# Patient Record
Sex: Male | Born: 1950 | Race: Black or African American | Hispanic: No | Marital: Married | Smoking: Never smoker
Health system: Southern US, Community
[De-identification: ages and names within clinical notes are randomized; demographics above are authoritative.]

## PROBLEM LIST (undated history)

## (undated) DIAGNOSIS — Z8719 Personal history of other diseases of the digestive system: Secondary | ICD-10-CM

## (undated) DIAGNOSIS — M1990 Unspecified osteoarthritis, unspecified site: Secondary | ICD-10-CM

## (undated) DIAGNOSIS — R0609 Other forms of dyspnea: Secondary | ICD-10-CM

## (undated) DIAGNOSIS — R0600 Dyspnea, unspecified: Secondary | ICD-10-CM

## (undated) DIAGNOSIS — F419 Anxiety disorder, unspecified: Secondary | ICD-10-CM

## (undated) DIAGNOSIS — Z77090 Contact with and (suspected) exposure to asbestos: Secondary | ICD-10-CM

## (undated) DIAGNOSIS — Z87442 Personal history of urinary calculi: Secondary | ICD-10-CM

## (undated) DIAGNOSIS — R319 Hematuria, unspecified: Secondary | ICD-10-CM

## (undated) DIAGNOSIS — N200 Calculus of kidney: Secondary | ICD-10-CM

## (undated) DIAGNOSIS — E119 Type 2 diabetes mellitus without complications: Secondary | ICD-10-CM

## (undated) HISTORY — DX: Anxiety disorder, unspecified: F41.9

## (undated) HISTORY — DX: Unspecified osteoarthritis, unspecified site: M19.90

## (undated) HISTORY — PX: KNEE SURGERY: SHX244

## (undated) HISTORY — PX: KNEE ARTHROSCOPY: SUR90

## (undated) HISTORY — PX: COLONOSCOPY: SHX174

## (undated) HISTORY — PX: LITHOTRIPSY: SUR834

---
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
## 2020-01-03 ENCOUNTER — Emergency Department (HOSPITAL_COMMUNITY): Payer: Medicare HMO

## 2020-01-03 ENCOUNTER — Encounter (HOSPITAL_COMMUNITY): Payer: Self-pay

## 2020-01-03 ENCOUNTER — Other Ambulatory Visit: Payer: Self-pay

## 2020-01-03 ENCOUNTER — Emergency Department (HOSPITAL_COMMUNITY)
Admission: EM | Admit: 2020-01-03 | Discharge: 2020-01-04 | Disposition: A | Discharge status: Home / Self Care | Payer: Medicare HMO | Attending: Emergency Medicine | Admitting: Emergency Medicine

## 2020-01-03 DIAGNOSIS — R319 Hematuria, unspecified: Secondary | ICD-10-CM | POA: Insufficient documentation

## 2020-01-03 DIAGNOSIS — R072 Precordial pain: Secondary | ICD-10-CM | POA: Diagnosis not present

## 2020-01-03 LAB — URINALYSIS, ROUTINE W REFLEX MICROSCOPIC

## 2020-01-03 LAB — COMPREHENSIVE METABOLIC PANEL
ALT: 24 U/L (ref 0–44)
AST: 20 U/L (ref 15–41)
Albumin: 4.1 g/dL (ref 3.5–5.0)
Alkaline Phosphatase: 56 U/L (ref 38–126)
Anion gap: 11 (ref 5–15)
BUN: 13 mg/dL (ref 8–23)
CO2: 24 mmol/L (ref 22–32)
Calcium: 9.4 mg/dL (ref 8.9–10.3)
Chloride: 104 mmol/L (ref 98–111)
Creatinine, Ser: 1.21 mg/dL (ref 0.61–1.24)
GFR calc Af Amer: >60 mL/min (ref >60)
GFR calc non Af Amer: >60 mL/min (ref >60)
Glucose, Bld: 122 mg/dL — ABNORMAL HIGH (ref 70–99)
Potassium: 3.7 mmol/L (ref 3.5–5.1)
Sodium: 139 mmol/L (ref 135–145)
Total Bilirubin: 0.9 mg/dL (ref 0.3–1.2)
Total Protein: 7.8 g/dL (ref 6.5–8.1)

## 2020-01-03 LAB — CBC WITH DIFFERENTIAL/PLATELET
Abs Immature Granulocytes: 0.02 10*3/uL (ref 0.00–0.07)
Basophils Absolute: 0 10*3/uL (ref 0.0–0.1)
Basophils Relative: 1 %
Eosinophils Absolute: 0.1 10*3/uL (ref 0.0–0.5)
Eosinophils Relative: 1 %
HCT: 46.2 % (ref 39.0–52.0)
Hemoglobin: 15.1 g/dL (ref 13.0–17.0)
Immature Granulocytes: 0 %
Lymphocytes Relative: 20 %
Lymphs Abs: 1.4 10*3/uL (ref 0.7–4.0)
MCH: 32.8 pg (ref 26.0–34.0)
MCHC: 32.7 g/dL (ref 30.0–36.0)
MCV: 100.4 fL — ABNORMAL HIGH (ref 80.0–100.0)
Monocytes Absolute: 0.5 10*3/uL (ref 0.1–1.0)
Monocytes Relative: 7 %
Neutro Abs: 5 10*3/uL (ref 1.7–7.7)
Neutrophils Relative %: 71 %
Platelets: 253 10*3/uL (ref 150–400)
RBC: 4.6 MIL/uL (ref 4.22–5.81)
RDW: 13.2 % (ref 11.5–15.5)
WBC: 7 10*3/uL (ref 4.0–10.5)
nRBC: 0 % (ref 0.0–0.2)

## 2020-01-03 LAB — URINALYSIS, MICROSCOPIC (REFLEX): RBC / HPF: >50 RBC/hpf (ref 0–5)

## 2020-01-03 LAB — TROPONIN I (HIGH SENSITIVITY)
Troponin I (High Sensitivity): 6 ng/L (ref <18)
Troponin I (High Sensitivity): 7 ng/L (ref <18)

## 2020-01-03 MED ORDER — CEPHALEXIN 500 MG PO CAPS: 500.0000 mg | ORAL_CAPSULE | Freq: Four times a day (QID) | ORAL | 0 refills | Status: DC

## 2020-01-03 NOTE — ED Notes (Signed)
Pt in waiting room, pt states that he started having some pressure in his chest, ekg done, pt to hallway 1

## 2020-01-03 NOTE — ED Provider Notes (Signed)
[facility] EMERGENCY DEPARTMENT Provider Note   CSN: [id number] Arrival date & time: 01/03/20  1219     History Chief Complaint  Patient presents with  . Hematuria    Zachary Morrison is a 69 y.o. male.  Patient with a 2 to 3-week history of intermittent gross blood in the urine.  Patient in the past has had trouble with kidney stones.  He has had no abdominal pain or flank pain or back pain with this episode.  [facility] contacted [facility] they recommend he come in in the emergency department for evaluation.  He has not been seen by them in the past.  Patient is from [place].  Needs to see a urologist up there.  Today urine was very dark in color.  But since he has been waiting he has urinated 2 or 3 times and it has been clear.  No gross hematuria.  Patient while in the waiting room somewhere around 6:30 PM developed some intermittent anterior chest pain.  Lasting only a minute or 2.  Not constant.  Patient has not had anything like that before.  Past medical history noncontributory.  Patient denies any history of cardiac disease.  His past medical history is only been for the lithotripsy 3 times.  Patient currently chest pain has resolved.        History reviewed. No pertinent past medical history.  There are no problems to display for this patient.   Past Surgical History:  Procedure Laterality Date  . KNEE SURGERY    . LITHOTRIPSY     three times       History reviewed. No pertinent family history.  Social History   Tobacco Use  . Smoking status: Never Smoker  . Smokeless tobacco: Never Used  Vaping Use  . Vaping Use: Never used  Substance Use Topics  . Alcohol use: Not Currently  . Drug use: Never    Home Medications Prior to Admission medications   Not on File    Allergies    Patient has no allergy information on record.  Review of Systems   Review of Systems  Constitutional: Negative for chills and fever.  HENT: Negative for congestion,  rhinorrhea and sore throat.   Eyes: Negative for visual disturbance.  Respiratory: Negative for cough and shortness of breath.   Cardiovascular: Positive for chest pain. Negative for leg swelling.  Gastrointestinal: Negative for abdominal pain, diarrhea, nausea and vomiting.  Genitourinary: Positive for hematuria. Negative for dysuria.  Musculoskeletal: Negative for back pain and neck pain.  Skin: Negative for rash.  Neurological: Negative for dizziness, light-headedness and headaches.  Hematological: Does not bruise/bleed easily.  Psychiatric/Behavioral: Negative for confusion.    Physical Exam Updated Vital Signs BP (!) 195/101 (BP Location: Right Arm)   Pulse 83   Temp (!) 97.5 F (36.4 C) (Oral)   Resp (!) 22   Ht 1.829 m (6')   Wt 85.7 kg   SpO2 100%   BMI 25.63 kg/m   Physical Exam Vitals and nursing note reviewed.  Constitutional:      Appearance: Normal appearance. He is well-developed.  HENT:     Head: Normocephalic and atraumatic.  Eyes:     Extraocular Movements: Extraocular movements intact.     Conjunctiva/sclera: Conjunctivae normal.     Pupils: Pupils are equal, round, and reactive to light.  Cardiovascular:     Rate and Rhythm: Normal rate and regular rhythm.     Heart sounds: No murmur heard.   Pulmonary:  Effort: Pulmonary effort is normal. No respiratory distress.     Breath sounds: Normal breath sounds.  Abdominal:     Palpations: Abdomen is soft.     Tenderness: There is no abdominal tenderness.     Comments: Patient with a infraumbilical hernia repair scar.  Has a little bit of a bulging mass to the right side of the umbilicus.  But nontender.  Musculoskeletal:        General: No swelling, tenderness or deformity. Normal range of motion.     Cervical back: Normal range of motion and neck supple.  Skin:    General: Skin is warm and dry.  Neurological:     General: No focal deficit present.     Mental Status: He is alert and oriented to  person, place, and time.     Cranial Nerves: No cranial nerve deficit.     Sensory: No sensory deficit.     ED Results / Procedures / Treatments   Labs (all labs ordered are listed, but only abnormal results are displayed) Labs Reviewed  URINALYSIS, ROUTINE W REFLEX MICROSCOPIC - Abnormal; Notable for the following components:      Result Value   Color, Urine RED (*)    APPearance TURBID (*)    Glucose, UA   (*)    Value: TEST NOT REPORTED DUE TO COLOR INTERFERENCE OF URINE PIGMENT   Hgb urine dipstick   (*)    Value: TEST NOT REPORTED DUE TO COLOR INTERFERENCE OF URINE PIGMENT   Bilirubin Urine   (*)    Value: TEST NOT REPORTED DUE TO COLOR INTERFERENCE OF URINE PIGMENT   Ketones, ur   (*)    Value: TEST NOT REPORTED DUE TO COLOR INTERFERENCE OF URINE PIGMENT   Protein, ur   (*)    Value: TEST NOT REPORTED DUE TO COLOR INTERFERENCE OF URINE PIGMENT   Nitrite   (*)    Value: TEST NOT REPORTED DUE TO COLOR INTERFERENCE OF URINE PIGMENT   Leukocytes,Ua   (*)    Value: TEST NOT REPORTED DUE TO COLOR INTERFERENCE OF URINE PIGMENT   All other components within normal limits  URINALYSIS, MICROSCOPIC (REFLEX) - Abnormal; Notable for the following components:   Bacteria, UA FEW (*)    Non Squamous Epithelial PRESENT (*)    All other components within normal limits  COMPREHENSIVE METABOLIC PANEL - Abnormal; Notable for the following components:   Glucose, Bld 122 (*)    All other components within normal limits  CBC WITH DIFFERENTIAL/PLATELET - Abnormal; Notable for the following components:   MCV 100.4 (*)    All other components within normal limits  URINE CULTURE  TROPONIN I (HIGH SENSITIVITY)  TROPONIN I (HIGH SENSITIVITY)    EKG EKG Interpretation  Date/Time:  Friday January 03 2020 18:47:49 EDT Ventricular Rate:  74 PR Interval:  180 QRS Duration: 82 QT Interval:  392 QTC Calculation: 435 R Axis:   44 Text Interpretation: Normal sinus rhythm Normal ECG no  previous) Confirmed by Fredia Sorrow [phone]) on 01/03/2020 7:19:07 PM   Radiology DG Chest 2 View  Result Date: 01/03/2020 CLINICAL DATA:  Chest pain EXAM: CHEST - 2 VIEW COMPARISON:  None. FINDINGS: Probable emphysematous disease. Streaky lower lobe opacities. Normal heart size. No pneumothorax. IMPRESSION: Probable emphysematous disease. Streaky lower lobe opacities may reflect chronic bronchitic change though no priors available for comparison. Could also consider atypical or viral pneumonia. Electronically Signed   By: Donavan Foil M.D.   On:  01/03/2020 19:54    Procedures Procedures (including critical care time)  Medications Ordered in ED Medications - No data to display  ED Course  I have reviewed the triage vital signs and the nursing notes.  Pertinent labs & imaging results that were available during my care of the patient were reviewed by me and considered in my medical decision making (see chart for details).    MDM Rules/Calculators/A&P                          Chest x-ray raises some questions for an atypical or viral pneumonia.  But patient is completely asymptomatic.  Urine does have a lot of RBCs in it.  Urine sent for culture.  Patient's kidney functions very normal.  Patient's urine is now clear.  For the chest pain troponins x2 were negative.  Never had constant chest pain.  No prior cardiac history.  Pain only lasted a few minutes at a time and just started at about 6:30 PM.  No chest pain now.  We will start patient on Keflex plug him into follow-up with urology for the hematuria.  It is painless hematuria.      Final Clinical Impression(s) / ED Diagnoses Final diagnoses:  Hematuria, unspecified type  Precordial pain    Rx / DC Orders ED Discharge Orders    None       Fredia Sorrow, MD 01/03/20 2349

## 2020-01-03 NOTE — Discharge Instructions (Addendum)
Follow-up with your primary care doctor regarding the chest pain.  The work-up for that without any acute findings.  Urine culture sent and is pending.  For the hematuria take the antibiotic Keflex as directed.  On Tuesday call [facility] here in [place] for follow-up of the hematuria.  Dr. Gloriann Loan on-call tonight for [facility].  Return for any chest pain lasting 20 minutes or longer.  Return for the development of any new or worse symptoms regarding the hematuria or develop of any flank pain back pain or abdominal pain.  Or any fevers.

## 2020-01-03 NOTE — ED Triage Notes (Signed)
Pt to er, pt states that he is here for blood in his urine, states that it has been going on for the past two weeks. States that last night it was worse, today it is better.  Pt denies pain or discharge.

## 2020-01-05 LAB — URINE CULTURE: Culture: NO GROWTH

## 2020-01-16 ENCOUNTER — Other Ambulatory Visit: Payer: Self-pay | Admitting: Family Medicine

## 2020-01-21 ENCOUNTER — Other Ambulatory Visit (HOSPITAL_COMMUNITY): Payer: Self-pay | Admitting: Family Medicine

## 2020-01-21 ENCOUNTER — Other Ambulatory Visit: Payer: Self-pay | Admitting: Family Medicine

## 2020-01-21 DIAGNOSIS — R109 Unspecified abdominal pain: Secondary | ICD-10-CM

## 2020-01-28 ENCOUNTER — Encounter: Payer: Self-pay | Admitting: Urology

## 2020-01-28 ENCOUNTER — Other Ambulatory Visit: Payer: Self-pay

## 2020-01-28 ENCOUNTER — Ambulatory Visit (INDEPENDENT_AMBULATORY_CARE_PROVIDER_SITE_OTHER): Payer: Medicare HMO | Admitting: Urology

## 2020-01-28 VITALS — BP 129/72 | HR 93

## 2020-01-28 DIAGNOSIS — R319 Hematuria, unspecified: Secondary | ICD-10-CM

## 2020-01-28 DIAGNOSIS — N200 Calculus of kidney: Secondary | ICD-10-CM | POA: Diagnosis not present

## 2020-01-28 LAB — MICROSCOPIC EXAMINATION
Bacteria, UA: NONE SEEN
Epithelial Cells (non renal): NONE SEEN /hpf (ref 0–10)

## 2020-01-28 LAB — URINALYSIS, ROUTINE W REFLEX MICROSCOPIC
Bilirubin, UA: NEGATIVE
Glucose, UA: NEGATIVE
Ketones, UA: NEGATIVE
Leukocytes,UA: NEGATIVE
Nitrite, UA: NEGATIVE
Protein,UA: NEGATIVE
Specific Gravity, UA: 1.02 (ref 1.005–1.030)
Urobilinogen, Ur: 0.2 mg/dL (ref 0.2–1.0)
pH, UA: 5 (ref 5.0–7.5)

## 2020-01-28 NOTE — Progress Notes (Signed)
H&P  Chief Complaint: Gross Hematuria  History of Present Illness: Pt is here for f/u of hematuria after hospitalization. Pt reports that he has a history of kidney stones including 3 surgeries (sounds like ureteroscopies). Pt first noticed blood in his urine approximately 2 years ago and went to the hospital when the "bleeding got heavy". During this period, he experienced hematuria more than once per month and that these episodes were accompanied with dysuria and flank pain (R). Pt reports no gross hematuria following hospitalization. Pt not currently taking aspirin or any other anticoagulents.   No past medical history on file.  Past Surgical History:  Procedure Laterality Date  . KNEE SURGERY    . LITHOTRIPSY     three times    Home Medications:  Allergies as of 01/28/2020   Not on File     Medication List       Accurate as of January 28, 2020 10:06 AM. If you have any questions, ask your nurse or doctor.        cephALEXin 500 MG capsule Commonly known as: KEFLEX Take 1 capsule (500 mg total) by mouth 4 (four) times daily.       Allergies: Not on File  No family history on file.  Social History:  reports that he has never smoked. He has never used smokeless tobacco. He reports previous alcohol use. He reports that he does not use drugs.  ROS: A complete review of systems was performed.  All systems are negative except for pertinent findings as noted.  Physical Exam:  Vital signs in last 24 hours: There were no vitals taken for this visit. Constitutional:  Alert and oriented, No acute distress Respiratory: Normal respiratory effort GI: Abdomen is soft, nontender, nondistended, no abdominal masses. Mild right sidee CVAT. Tenderness on RLQ. Neurologic: Grossly intact, no focal deficits Psychiatric: Normal mood and affect  I have reviewed prior pt notes  I have reviewed notes from referring/previous physicians--Er notes  I have reviewed urinalysis  results    I have reviewed prior urine culture Radiologic Imaging: No results found.  Impression/Assessment:  Gross hematuria, intermittent over a long period of time with history of tobacco use but also significant for urolithiasis.  Plan:  1. Pt reassured regarding his symptomatology and advised regarding hematuria protocol - CT scan and then cystoscopy.  2. Pt to have CT scan at next available appointment. Once scheduled, cancel his previously scheduled CT scan (for 02/03/2020).  3. Will F/U with OV and cysto at next available appt. following CT scan.  CC: Dr. Ledell Peoples

## 2020-01-28 NOTE — Progress Notes (Signed)
Urological Symptom Review  Patient is experiencing the following symptoms: Blood in urine   Review of Systems  Gastrointestinal (upper)  : Negative for upper GI symptoms  Gastrointestinal (lower) : Negative for lower GI symptoms  Constitutional : Negative for symptoms  Skin: Negative for skin symptoms  Eyes: Negative for eye symptoms  Ear/Nose/Throat : Negative for Ear/Nose/Throat symptoms  Hematologic/Lymphatic: Negative for Hematologic/Lymphatic symptoms  Cardiovascular : Negative for cardiovascular symptoms  Respiratory : Shortness of breath  Endocrine: Negative for endocrine symptoms  Musculoskeletal: Negative for musculoskeletal symptoms  Neurological: Negative for neurological symptoms  Psychologic: Negative for psychiatric symptoms  

## 2020-01-31 ENCOUNTER — Telehealth: Payer: Self-pay | Admitting: Urology

## 2020-01-31 NOTE — Telephone Encounter (Signed)
Left message for pt that Dr. Diona Fanti order states he does not need oral contrast.

## 2020-01-31 NOTE — Telephone Encounter (Signed)
Our order for CT said no oral contrast but PCP order says oral contrast. Patient asked if he needs to pick it up or not?

## 2020-02-03 ENCOUNTER — Ambulatory Visit (HOSPITAL_COMMUNITY): Payer: Medicare HMO

## 2020-02-03 ENCOUNTER — Encounter (HOSPITAL_COMMUNITY): Payer: Self-pay

## 2020-02-14 ENCOUNTER — Other Ambulatory Visit (HOSPITAL_COMMUNITY): Payer: Medicare HMO

## 2020-02-14 ENCOUNTER — Ambulatory Visit (HOSPITAL_COMMUNITY)
Admission: RE | Admit: 2020-02-14 | Discharge: 2020-02-14 | Disposition: A | Discharge status: Home / Self Care | Payer: Medicare HMO | Source: Ambulatory Visit | Attending: Urology | Admitting: Urology

## 2020-02-14 ENCOUNTER — Encounter (HOSPITAL_COMMUNITY): Payer: Self-pay

## 2020-02-14 DIAGNOSIS — R319 Hematuria, unspecified: Secondary | ICD-10-CM

## 2020-02-14 LAB — POCT I-STAT CREATININE: Creatinine, Ser: 0.8 mg/dL (ref 0.61–1.24)

## 2020-02-14 MED ORDER — IOHEXOL 350 MG/ML SOLN
100.0000 mL | Freq: Once | INTRAVENOUS | Status: AC | PRN
  Administered 2020-02-14: 100 mL via INTRAVENOUS

## 2020-02-25 ENCOUNTER — Other Ambulatory Visit: Payer: Self-pay

## 2020-02-25 ENCOUNTER — Ambulatory Visit (INDEPENDENT_AMBULATORY_CARE_PROVIDER_SITE_OTHER): Payer: Medicare HMO | Admitting: Urology

## 2020-02-25 ENCOUNTER — Encounter: Payer: Self-pay | Admitting: Urology

## 2020-02-25 VITALS — BP 162/70 | HR 75 | Temp 98.7°F | Ht 72.0 in | Wt 189.0 lb

## 2020-02-25 DIAGNOSIS — R319 Hematuria, unspecified: Secondary | ICD-10-CM | POA: Diagnosis not present

## 2020-02-25 DIAGNOSIS — N200 Calculus of kidney: Secondary | ICD-10-CM

## 2020-02-25 LAB — MICROSCOPIC EXAMINATION
Bacteria, UA: NONE SEEN
Epithelial Cells (non renal): NONE SEEN /hpf (ref 0–10)
Renal Epithel, UA: NONE SEEN /hpf
WBC, UA: NONE SEEN /hpf (ref 0–5)

## 2020-02-25 LAB — URINALYSIS, ROUTINE W REFLEX MICROSCOPIC
Bilirubin, UA: NEGATIVE
Glucose, UA: NEGATIVE
Ketones, UA: NEGATIVE
Leukocytes,UA: NEGATIVE
Nitrite, UA: NEGATIVE
Protein,UA: NEGATIVE
Specific Gravity, UA: 1.025 (ref 1.005–1.030)
Urobilinogen, Ur: 0.2 mg/dL (ref 0.2–1.0)
pH, UA: 5 (ref 5.0–7.5)

## 2020-02-25 MED ORDER — CIPROFLOXACIN HCL 500 MG PO TABS: 500.0000 mg | ORAL_TABLET | Freq: Once | ORAL | Status: DC

## 2020-02-25 NOTE — Addendum Note (Signed)
Addended byIris Pert on: 02/25/2020 04:28 PM   Modules accepted: Orders

## 2020-02-25 NOTE — Progress Notes (Signed)
Urological Symptom Review  Patient is experiencing the following symptoms: Frequent urination Burning/pain with urination Get up at night to urinate Stream starts and stops Blood in urine Erection problems (male only)   Review of Systems  Gastrointestinal (upper)  : Negative for upper GI symptoms  Gastrointestinal (lower) : Negative for lower GI symptoms  Constitutional : Fatigue  Skin: Negative for skin symptoms  Eyes: Negative for eye symptoms  Ear/Nose/Throat : Sinus problems  Hematologic/Lymphatic: Negative for Hematologic/Lymphatic symptoms  Cardiovascular : Negative for cardiovascular symptoms  Respiratory : Shortness of breath  Endocrine: Negative for endocrine symptoms  Musculoskeletal: Joint pain  Neurological: Negative for neurological symptoms  Psychologic: Negative for psychiatric symptoms

## 2020-02-25 NOTE — Progress Notes (Signed)
   History of Present Illness: Here for follow-up of hematuria.  9.28.2021: Pt is here for f/u of hematuria after hospitalization. Pt reports that he has a history of kidney stones including 3 surgeries (sounds like ureteroscopies). Pt first noticed blood in his urine approximately 2 years ago and went to the hospital when the "bleeding got heavy". During this period, he experienced hematuria more than once per month and that these episodes were accompanied with dysuria and flank pain (R). Pt reports no gross hematuria following hospitalization. Pt not currently taking aspirin or any other anticoagulents.  10.15.2021: Hematuria CT-- 1. The distal left ureter is patulous, with a somewhat nodular and web-like filling defect in the distal third of the ureter, difficult to accurately measure although approximately 5-6 mm in size and is located approximately 5 cm proximal to the ureterovesicular junction. This may reflect clot within the ureter although is somewhat concerning for urothelial malignancy. 2. No evidence of calculus, mass, or other filling defect. 3. 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 4. Aortic Atherosclerosis (ICD10-I70.0). 5.  Emphysema (ICD10-J43.9).  10.26.2021: Past Medical History:  Diagnosis Date  . Anxiety   . Arthritis   . Kidney stone     Past Surgical History:  Procedure Laterality Date  . KNEE SURGERY    . LITHOTRIPSY     three times    Home Medications:  Allergies as of 02/25/2020   No Known Allergies     Medication List       Accurate as of February 25, 2020 12:37 PM. If you have any questions, ask your nurse or doctor.        cephALEXin 500 MG capsule Commonly known as: KEFLEX Take 1 capsule (500 mg total) by mouth 4 (four) times daily.       Allergies: No Known Allergies  No family history on file.  Social History:  reports that he has never smoked. He has never used  smokeless tobacco. He reports previous alcohol use. He reports that he does not use drugs.  ROS: A complete review of systems was performed.  All systems are negative except for pertinent findings as noted.  Physical Exam:  Vital signs in last 24 hours: There were no vitals taken for this visit. Constitutional:  Alert and oriented, No acute distress Cardiovascular: Regular rate  Respiratory: Normal respiratory effort GI: Abdomen is soft, nontender, nondistended, no abdominal masses. No CVAT.  Genitourinary: Normal male phallus, testes are descended bilaterally and non-tender and without masses, scrotum is normal in appearance without lesions or masses, perineum is normal on inspection. Lymphatic: No lymphadenopathy Neurologic: Grossly intact, no focal deficits Psychiatric: Normal mood and affect  I have reviewed prior pt notes  I have reviewed notes from referring/previous physicians  I have reviewed urinalysis results  I have independently reviewed prior imaging--recent CT    Impression/Assessment:  Hematuria with left ureteral abnormality, in the left distal ureter.  This could be just a spurious finding or an intra ureteral abnormality  Plan:  1.  I will send urine for cytology and urine culture  2.  I skipped the cystoscopy today, as I think he will need anesthetic cystoscopy, left retrograde ureteropyelogram, possible left ureteroscopy.  I let him know that if he does have a small bladder abnormality we would resect that at the time.  We will do this as an outpatient

## 2020-02-27 LAB — CYTOLOGY, URINE

## 2020-02-28 LAB — URINE CULTURE

## 2020-03-06 ENCOUNTER — Other Ambulatory Visit: Payer: Self-pay | Admitting: Urology

## 2020-03-09 ENCOUNTER — Encounter (HOSPITAL_COMMUNITY): Payer: Self-pay | Admitting: Urology

## 2020-03-09 ENCOUNTER — Other Ambulatory Visit: Payer: Self-pay

## 2020-03-09 ENCOUNTER — Other Ambulatory Visit (HOSPITAL_COMMUNITY)
Admission: RE | Admit: 2020-03-09 | Discharge: 2020-03-09 | Disposition: A | Discharge status: Home / Self Care | Payer: Medicare HMO | Source: Ambulatory Visit | Attending: Urology | Admitting: Urology

## 2020-03-09 DIAGNOSIS — Z01812 Encounter for preprocedural laboratory examination: Secondary | ICD-10-CM | POA: Diagnosis present

## 2020-03-09 DIAGNOSIS — Z20822 Contact with and (suspected) exposure to covid-19: Secondary | ICD-10-CM | POA: Insufficient documentation

## 2020-03-09 LAB — SARS CORONAVIRUS 2 (TAT 6-24 HRS): SARS Coronavirus 2: NEGATIVE

## 2020-03-09 NOTE — Progress Notes (Addendum)
COVID Vaccine Completed: Yes Date COVID Vaccine completed: 05/31/19, 06/28/2019 COVID vaccine manufacturer:  Moderna     PCP - Dr. Ledell Peoples, [place], [place] Cardiologist -  Has not seen in over 10 years  Chest x-ray - 01/03/20 in epic EKG - 01/08/20 in epic Stress Test - greater than 2 years ECHO - greater than 2 years Cardiac Cath - greater than 2 years Pacemaker/ICD device last checked:N/A  Sleep Study - N/A CPAP -  N/A   Fasting Blood Sugar -  N/A Checks Blood Sugar __ N/A___ times a day  Blood Thinner Instructions: N/A Aspirin Instructions: N/A Last Dose: N/A  Anesthesia review: N/A  Patient denies shortness of breath, fever, cough and chest pain at PAT appointment   Patient verbalized understanding of instructions that were given to them at the PAT appointment. Patient was also instructed that they will need to review over the PAT instructions again at home before surgery.

## 2020-03-10 NOTE — Patient Instructions (Addendum)
DUE TO COVID-19 ONLY ONE VISITOR IS ALLOWED TO COME WITH YOU AND STAY IN THE WAITING ROOM ONLY DURING PRE OP AND PROCEDURE DAY OF SURGERY. THE 1 VISITOR  MAY VISIT WITH YOU AFTER SURGERY IN YOUR PRIVATE ROOM DURING VISITING HOURS ONLY!  ONCE YOUR COVID TEST IS COMPLETED,  PLEASE BEGIN THE QUARANTINE INSTRUCTIONS AS OUTLINED IN YOUR HANDOUT.                Zachary Morrison    Your procedure is scheduled on: 03/12/20   Report to [facility] Main  Entrance   Report to Short stay at 5:30 AM     Call this number if you have problems the morning of surgery [phone]    Remember: Do not eat food or drink liquids :After Midnight  . BRUSH YOUR TEETH MORNING OF SURGERY AND RINSE YOUR MOUTH OUT, NO CHEWING GUM CANDY OR MINTS.     Take these medicines the morning of surgery with A SIP OF WATER: none                                 You may not have any metal on your body including              piercings  Do not wear jewelry,  lotions, powders or deodorant             Men may shave face and neck.   Do not bring valuables to the hospital. [place].  Contacts, dentures or bridgework may not be worn into surgery.  .     Patients discharged the day of surgery will not be allowed to drive home  . IF YOU ARE HAVING SURGERY AND GOING HOME THE SAME DAY, YOU MUST HAVE AN ADULT TO DRIVE YOU HOME AND BE WITH YOU FOR 24 HOURS.   YOU MAY GO HOME BY TAXI OR UBER OR ORTHERWISE, BUT AN ADULT MUST ACCOMPANY YOU HOME AND STAY WITH YOU FOR 24 HOURS.  Name and phone number of your driver:  Special Instructions: N/A              Please read over the following fact sheets you were given: _____________________________________________________________________             [facility] - Preparing for Surgery  Before surgery, you can play an important role.   Because skin is not sterile, your skin needs to be as free of germs as possible.   You can  reduce the number of germs on your skin by washing with CHG (chlorahexidine gluconate) soap before surgery.   CHG is an antiseptic cleaner which kills germs and bonds with the skin to continue killing germs even after washing. Please DO NOT use if you have an allergy to CHG or antibacterial soaps.   If your skin becomes reddened/irritated stop using the CHG and inform your nurse when you arrive at Short Stay.  You may shave your face/neck.  Please follow these instructions carefully:  1.  Shower with CHG Soap the night before surgery and the  morning of Surgery.  2.  If you choose to wash your hair, wash your hair first as usual with your  normal  shampoo.  3.  After you shampoo, rinse your hair and body thoroughly to remove the  shampoo.  4.  Use CHG as you would any other liquid soap.  You can apply chg directly  to the skin and wash                       Gently with a scrungie or clean washcloth.  5.  Apply the CHG Soap to your body ONLY FROM THE NECK DOWN.   Do not use on face/ open                           Wound or open sores. Avoid contact with eyes, ears mouth and genitals (private parts).                       Wash face,  Genitals (private parts) with your normal soap.             6.  Wash thoroughly, paying special attention to the area where your surgery  will be performed.  7.  Thoroughly rinse your body with warm water from the neck down.  8.  DO NOT shower/wash with your normal soap after using and rinsing off  the CHG Soap.             9.  Pat yourself dry with a clean towel.            10.  Wear clean pajamas.            11.  Place clean sheets on your bed the night of your first shower and do not  sleep with pets. Day of Surgery : Do not apply any lotions/deodorants the morning of surgery.  Please wear clean clothes to the hospital/surgery center.  FAILURE TO FOLLOW THESE INSTRUCTIONS MAY RESULT IN THE CANCELLATION OF YOUR  SURGERY PATIENT SIGNATURE_________________________________  NURSE SIGNATURE__________________________________  ________________________________________________________________________

## 2020-03-11 ENCOUNTER — Inpatient Hospital Stay (HOSPITAL_COMMUNITY)
Admission: RE | Admit: 2020-03-11 | Discharge: 2020-03-11 | Disposition: A | Discharge status: Home / Self Care | Payer: Medicare HMO | Source: Ambulatory Visit

## 2020-03-11 NOTE — H&P (Signed)
H&P   History of Present Illness: 69 yo male presents for cysto, Lt RGP, possible Lt URS and stent placement. He has had evaluation for hematuria--CT revealed widened Lt ureter w/ distal filling defect. I have discussed mgmt/further eval, thus his presentation today.  Past Medical History:  Diagnosis Date  . Anxiety   . Arthritis   . Asbestos exposure   . DOE (dyspnea on exertion)    history of asbestos exposure  . Hematuria   . History of hiatal hernia   . History of kidney stones     Past Surgical History:  Procedure Laterality Date  . COLONOSCOPY    . KNEE ARTHROSCOPY Left   . LITHOTRIPSY     three times    Home Medications:  Allergies as of 03/11/2020   No Known Allergies     Medication List    Notice   Cannot display discharge medications because the patient has not yet been admitted.     Allergies: No Known Allergies  History reviewed. No pertinent family history.  Social History:  reports that he has never smoked. He has never used smokeless tobacco. He reports previous alcohol use. He reports that he does not use drugs.  ROS: A complete review of systems was performed.  All systems are negative except for pertinent findings as noted.  Physical Exam:  Vital signs in last 24 hours: Ht 6' (1.829 m)   Wt 88.5 kg   BMI 26.45 kg/m  Constitutional:  Alert and oriented, No acute distress Cardiovascular: Regular rate  Respiratory: Normal respiratory effort GI: Abdomen is soft, nontender, nondistended, no abdominal masses. No CVAT.  Genitourinary: Normal male phallus, testes are descended bilaterally and non-tender and without masses, scrotum is normal in appearance without lesions or masses, perineum is normal on inspection. Lymphatic: No lymphadenopathy Neurologic: Grossly intact, no focal deficits Psychiatric: Normal mood and affect  Laboratory Data:  No results for input(s): WBC, HGB, HCT, PLT in the last 72 hours.  No results for input(s): NA, K, CL,  GLUCOSE, BUN, CALCIUM, CREATININE in the last 72 hours.  Invalid input(s): CO3   No results found for this or any previous visit (from the past 24 hour(s)). Recent Results (from the past 240 hour(s))  SARS CORONAVIRUS 2 (TAT 6-24 HRS) Nasopharyngeal Nasopharyngeal Swab     Status: None   Collection Time: 03/09/20 11:38 AM   Specimen: Nasopharyngeal Swab  Result Value Ref Range Status   SARS Coronavirus 2 NEGATIVE NEGATIVE Final    Comment: (NOTE) SARS-CoV-2 target nucleic acids are NOT DETECTED.  The SARS-CoV-2 RNA is generally detectable in upper and lower respiratory specimens during the acute phase of infection. Negative results do not preclude SARS-CoV-2 infection, do not rule out co-infections with other pathogens, and should not be used as the sole basis for treatment or other patient management decisions. Negative results must be combined with clinical observations, patient history, and epidemiological information. The expected result is Negative.  Fact Sheet for Patients: SugarRoll.be  Fact Sheet for Healthcare Providers: https://www.woods-mathews.com/  This test is not yet approved or cleared by the [place] FDA and  has been authorized for detection and/or diagnosis of SARS-CoV-2 by FDA under an Emergency Use Authorization (EUA). This EUA will remain  in effect (meaning this test can be used) for the duration of the COVID-19 declaration under Se ction 564(b)(1) of the Act, 21 U.S.C. section 360bbb-3(b)(1), unless the authorization is terminated or revoked sooner.  Performed at [facility], [place]  [address], [place], [place] [address]     Renal Function: No results for input(s): CREATININE in the last 168 hours. CrCl cannot be calculated (Patient's most recent lab result is older than the maximum 21 days allowed.).  Radiologic Imaging: No results found.  Impression/Assessment:  Hematuria, Lt ureteral  abnormality  Plan:  Cysto, Lt RGP, possible Lt URS, stent placement

## 2020-03-12 ENCOUNTER — Ambulatory Visit (HOSPITAL_COMMUNITY): Payer: Medicare HMO

## 2020-03-12 ENCOUNTER — Ambulatory Visit (HOSPITAL_COMMUNITY): Payer: Medicare HMO | Admitting: Anesthesiology

## 2020-03-12 ENCOUNTER — Ambulatory Visit (HOSPITAL_COMMUNITY)
Admission: RE | Admit: 2020-03-12 | Discharge: 2020-03-12 | Disposition: A | Discharge status: Home / Self Care | Payer: Medicare HMO | Attending: Urology | Admitting: Urology

## 2020-03-12 ENCOUNTER — Encounter (HOSPITAL_COMMUNITY)
Admission: RE | Disposition: A | Discharge status: Home / Self Care | Payer: Self-pay | Source: Home / Self Care | Attending: Urology

## 2020-03-12 ENCOUNTER — Encounter (HOSPITAL_COMMUNITY): Payer: Self-pay | Admitting: Urology

## 2020-03-12 DIAGNOSIS — R310 Gross hematuria: Secondary | ICD-10-CM | POA: Diagnosis present

## 2020-03-12 DIAGNOSIS — N132 Hydronephrosis with renal and ureteral calculous obstruction: Secondary | ICD-10-CM | POA: Insufficient documentation

## 2020-03-12 HISTORY — DX: Other forms of dyspnea: R06.09

## 2020-03-12 HISTORY — DX: Personal history of other diseases of the digestive system: Z87.19

## 2020-03-12 HISTORY — DX: Contact with and (suspected) exposure to asbestos: Z77.090

## 2020-03-12 HISTORY — DX: Personal history of urinary calculi: Z87.442

## 2020-03-12 HISTORY — DX: Dyspnea, unspecified: R06.00

## 2020-03-12 HISTORY — PX: CYSTOSCOPY WITH RETROGRADE PYELOGRAM, URETEROSCOPY AND STENT PLACEMENT: SHX5789

## 2020-03-12 HISTORY — DX: Hematuria, unspecified: R31.9

## 2020-03-12 LAB — CBC
HCT: 44.1 % (ref 39.0–52.0)
Hemoglobin: 14.9 g/dL (ref 13.0–17.0)
MCH: 32.7 pg (ref 26.0–34.0)
MCHC: 33.8 g/dL (ref 30.0–36.0)
MCV: 96.7 fL (ref 80.0–100.0)
Platelets: 195 10*3/uL (ref 150–400)
RBC: 4.56 MIL/uL (ref 4.22–5.81)
RDW: 13.5 % (ref 11.5–15.5)
WBC: 6.3 10*3/uL (ref 4.0–10.5)
nRBC: 0 % (ref 0.0–0.2)

## 2020-03-12 SURGERY — CYSTOURETEROSCOPY, WITH RETROGRADE PYELOGRAM AND STENT INSERTION
Anesthesia: General | Site: Ureter | Laterality: Left

## 2020-03-12 MED ORDER — FENTANYL CITRATE (PF) 100 MCG/2ML IJ SOLN: 25.0000 ug | INTRAMUSCULAR | Status: DC | PRN

## 2020-03-12 MED ORDER — DEXAMETHASONE SODIUM PHOSPHATE 10 MG/ML IJ SOLN
INTRAMUSCULAR | Status: DC | PRN
  Administered 2020-03-12: 10 mg via INTRAVENOUS

## 2020-03-12 MED ORDER — ONDANSETRON HCL 4 MG/2ML IJ SOLN
INTRAMUSCULAR | Status: DC | PRN
  Administered 2020-03-12: 4 mg via INTRAVENOUS

## 2020-03-12 MED ORDER — LIDOCAINE 2% (20 MG/ML) 5 ML SYRINGE
INTRAMUSCULAR | Status: AC
  Filled 2020-03-12: qty 5

## 2020-03-12 MED ORDER — ORAL CARE MOUTH RINSE: 15.0000 mL | Freq: Once | OROMUCOSAL | Status: AC

## 2020-03-12 MED ORDER — CEFAZOLIN SODIUM-DEXTROSE 2-4 GM/100ML-% IV SOLN
2.0000 g | INTRAVENOUS | Status: AC
  Administered 2020-03-12: 2 g via INTRAVENOUS
  Filled 2020-03-12: qty 100

## 2020-03-12 MED ORDER — PROPOFOL 10 MG/ML IV BOLUS
INTRAVENOUS | Status: DC | PRN
  Administered 2020-03-12: 160 mg via INTRAVENOUS

## 2020-03-12 MED ORDER — IOHEXOL 300 MG/ML  SOLN
INTRAMUSCULAR | Status: DC | PRN
  Administered 2020-03-12: 8 mL

## 2020-03-12 MED ORDER — AMISULPRIDE (ANTIEMETIC) 5 MG/2ML IV SOLN: 10.0000 mg | Freq: Once | INTRAVENOUS | Status: DC | PRN

## 2020-03-12 MED ORDER — LACTATED RINGERS IV SOLN: INTRAVENOUS | Status: DC

## 2020-03-12 MED ORDER — FENTANYL CITRATE (PF) 100 MCG/2ML IJ SOLN
INTRAMUSCULAR | Status: DC | PRN
  Administered 2020-03-12 (×3): 25 ug via INTRAVENOUS

## 2020-03-12 MED ORDER — DEXAMETHASONE SODIUM PHOSPHATE 10 MG/ML IJ SOLN
INTRAMUSCULAR | Status: AC
  Filled 2020-03-12: qty 1

## 2020-03-12 MED ORDER — CHLORHEXIDINE GLUCONATE 0.12 % MT SOLN
15.0000 mL | Freq: Once | OROMUCOSAL | Status: AC
  Administered 2020-03-12: 15 mL via OROMUCOSAL

## 2020-03-12 MED ORDER — MEPERIDINE HCL 50 MG/ML IJ SOLN: 6.2500 mg | INTRAMUSCULAR | Status: DC | PRN

## 2020-03-12 MED ORDER — ACETAMINOPHEN 10 MG/ML IV SOLN: 1000.0000 mg | Freq: Once | INTRAVENOUS | Status: DC | PRN

## 2020-03-12 MED ORDER — ONDANSETRON HCL 4 MG/2ML IJ SOLN
INTRAMUSCULAR | Status: AC
  Filled 2020-03-12: qty 2

## 2020-03-12 MED ORDER — PROPOFOL 10 MG/ML IV BOLUS
INTRAVENOUS | Status: AC
  Filled 2020-03-12: qty 20

## 2020-03-12 MED ORDER — 0.9 % SODIUM CHLORIDE (POUR BTL) OPTIME
TOPICAL | Status: DC | PRN
  Administered 2020-03-12: 1000 mL

## 2020-03-12 MED ORDER — CEPHALEXIN 500 MG PO CAPS: 500.0000 mg | ORAL_CAPSULE | Freq: Two times a day (BID) | ORAL | 0 refills | Status: DC

## 2020-03-12 MED ORDER — OXYBUTYNIN CHLORIDE 5 MG PO TABS: 5.0000 mg | ORAL_TABLET | Freq: Three times a day (TID) | ORAL | 1 refills | Status: DC | PRN

## 2020-03-12 MED ORDER — MIDAZOLAM HCL 5 MG/5ML IJ SOLN
INTRAMUSCULAR | Status: DC | PRN
  Administered 2020-03-12: 2 mg via INTRAVENOUS

## 2020-03-12 MED ORDER — ACETAMINOPHEN 325 MG PO TABS: 325.0000 mg | ORAL_TABLET | Freq: Once | ORAL | Status: DC | PRN

## 2020-03-12 MED ORDER — FENTANYL CITRATE (PF) 100 MCG/2ML IJ SOLN
INTRAMUSCULAR | Status: AC
  Filled 2020-03-12: qty 2

## 2020-03-12 MED ORDER — LIDOCAINE 2% (20 MG/ML) 5 ML SYRINGE
INTRAMUSCULAR | Status: DC | PRN
  Administered 2020-03-12: 60 mg via INTRAVENOUS
  Administered 2020-03-12: 40 mg via INTRAVENOUS

## 2020-03-12 MED ORDER — MIDAZOLAM HCL 2 MG/2ML IJ SOLN
INTRAMUSCULAR | Status: AC
  Filled 2020-03-12: qty 2

## 2020-03-12 MED ORDER — ACETAMINOPHEN 160 MG/5ML PO SOLN: 325.0000 mg | Freq: Once | ORAL | Status: DC | PRN

## 2020-03-12 MED ORDER — SODIUM CHLORIDE 0.9 % IR SOLN
Status: DC | PRN
  Administered 2020-03-12: 4000 mL

## 2020-03-12 SURGICAL SUPPLY — 24 items
BAG URO CATCHER STRL LF (MISCELLANEOUS) ×3 IMPLANT
BASKET ZERO TIP NITINOL 2.4FR (BASKET) ×3 IMPLANT
CATH INTERMIT  6FR 70CM (CATHETERS) IMPLANT
CLOTH BEACON ORANGE TIMEOUT ST (SAFETY) ×3 IMPLANT
EXTRACTOR STONE NITINOL NGAGE (UROLOGICAL SUPPLIES) ×3 IMPLANT
GLOVE BIOGEL M 8.0 STRL (GLOVE) ×3 IMPLANT
GOWN STRL REUS W/TWL XL LVL3 (GOWN DISPOSABLE) ×3 IMPLANT
GUIDEWIRE ANG ZIPWIRE 038X150 (WIRE) IMPLANT
GUIDEWIRE STR DUAL SENSOR (WIRE) ×6 IMPLANT
IV NS 1000ML (IV SOLUTION) ×2
IV NS 1000ML BAXH (IV SOLUTION) ×1 IMPLANT
KIT TURNOVER KIT A (KITS) IMPLANT
LASER FIB FLEXIVA PULSE ID 365 (Laser) ×3 IMPLANT
MANIFOLD NEPTUNE II (INSTRUMENTS) ×3 IMPLANT
PACK CYSTO (CUSTOM PROCEDURE TRAY) ×3 IMPLANT
SHEATH URETERAL 12FRX28CM (UROLOGICAL SUPPLIES) ×3 IMPLANT
SHEATH URETERAL 12FRX35CM (MISCELLANEOUS) IMPLANT
SHEATH URETERAL 12FRX55CM (UROLOGICAL SUPPLIES) IMPLANT
STENT URET 6FRX26 CONTOUR (STENTS) ×3 IMPLANT
TRACTIP FLEXIVA PULS ID 200XHI (Laser) IMPLANT
TRACTIP FLEXIVA PULSE ID 200 (Laser)
TUBING CONNECTING 10 (TUBING) ×2 IMPLANT
TUBING CONNECTING 10' (TUBING) ×1
TUBING UROLOGY SET (TUBING) ×3 IMPLANT

## 2020-03-12 NOTE — Interval H&P Note (Signed)
History and Physical Interval Note:  03/12/2020 7:26 AM  Zachary Morrison  has presented today for surgery, with the diagnosis of LEFT URETERAL LESION.  The various methods of treatment have been discussed with the patient and family. After consideration of risks, benefits and other options for treatment, the patient has consented to  Procedure(s) with comments: CYSTOSCOPY WITH RETROGRADE PYELOGRAM, URETEROSCOPY AND STENT PLACEMENT (Left) - 61 MINS as a surgical intervention.  The patient's history has been reviewed, patient examined, no change in status, stable for surgery.  I have reviewed the patient's chart and labs.  Questions were answered to the patient's satisfaction.     Lillette Boxer Burdette Gergely

## 2020-03-12 NOTE — Anesthesia Procedure Notes (Signed)
Procedure Name: LMA Insertion Date/Time: 03/12/2020 7:38 AM Performed by: Sharlette Dense, CRNA Patient Re-evaluated:Patient Re-evaluated prior to induction Oxygen Delivery Method: Circle system utilized Preoxygenation: Pre-oxygenation with 100% oxygen Induction Type: IV induction LMA: LMA inserted LMA Size: 5.0 Number of attempts: 1 Placement Confirmation: positive ETCO2 and breath sounds checked- equal and bilateral Tube secured with: Tape Dental Injury: Teeth and Oropharynx as per pre-operative assessment

## 2020-03-12 NOTE — Transfer of Care (Signed)
Immediate Anesthesia Transfer of Care Note  Patient: Chrystopher Follansbee  Procedure(s) Performed: CYSTOSCOPY WITH RETROGRADE PYELOGRAM, URETEROSCOPY, STONE EXTRACTION  AND DOUBLE J STENT PLACEMENT (Left Ureter)  Patient Location: PACU  Anesthesia Type:General  Level of Consciousness: drowsy  Airway & Oxygen Therapy: Patient Spontanous Breathing and Patient connected to face mask oxygen  Post-op Assessment: Report given to RN and Post -op Vital signs reviewed and stable  Post vital signs: Reviewed and stable  Last Vitals:  Vitals Value Taken Time  BP 145/74 03/12/20 0841  Temp    Pulse 48 03/12/20 0842  Resp 16 03/12/20 0842  SpO2 97 % 03/12/20 0842  Vitals shown include unvalidated device data.  Last Pain:  Vitals:   03/12/20 0626  TempSrc:   PainSc: 0-No pain      Patients Stated Pain Goal: 3 (07/01/47 9692)  Complications: No complications documented.

## 2020-03-12 NOTE — Op Note (Signed)
Preoperative diagnosis: Left ureteral abnormality with hydronephrosis  Postoperative diagnosis: Left distal ureteral calculus  Principal procedure: Cystoscopy, left retrograde ureteropyelogram, fluoroscopic interpretation, left ureteroscopy, extraction of left ureteral stone, placement of 6 French by 26 cm contour double-J stent without tether  Surgeon: Axcel Horsch  Anesthesia: General with LMA  Complications: None  Specimen: Stone  Estimated blood loss: Less than 10 mL  Indications: 69 year old male with recurrent gross hematuria.  He has had evaluation in the office, including CT and cystoscopy.  CT revealed left distal ureteral filling defect, noncalcified.  He presents at this time for cystoscopy, retrograde, ureteroscopic investigation of the left ureter, possible biopsy, double-J stent placement.  I discussed the procedure with the patient including risks and complications which include bleeding, infection, need for stent, anesthetic complication, ureteral injury among others.  He understands and desires to proceed.  Findings: He had trilobar hypertrophy with moderate sized median lobe.  Moderate trabeculations throughout the bladder with normal urothelium otherwise.  Ureteral orifices somewhat difficult to identify but were normal bilaterally.  Retrograde study of the left ureter revealed J hooking of the distal ureter with filling defect consistent with probable stone.  There was no evidence of proximal dilatation and pyelocalyceal system was normal without filling defect.  Stone was tan/brown, rounded, indicative of probable uric acid stone as it was not readily seen on CT scan.  Description of procedure: The patient was properly identified and marked in the holding area.  He received preoperative IV antibiotics and was taken to the operating room where general anesthetic was administered with the LMA.  Is placed in the dorsolithotomy position.  Genitalia and perineum were prepped and  draped, proper timeout performed.  21 French panendoscope advanced into the bladder with the above-mentioned findings noted.  Retrograde study was performed of the left ureter with the assistance of a guidewire to place the open-ended catheter as there was significant J hooking.  The above-mentioned filling defect was seen.  I then advanced a guidewire with the assistance of the open-ended catheter into the upper pole calyceal system using fluoroscopic guidance.  Cystoscope and open-ended catheter were removed, I dilated the ureteral orifice with the first the inner core and then the entire 12/14 ureteral access catheter.  This was then removed over top of the safety guidewire.  The dual-lumen semirigid short ureteroscope was advanced into the left ureter.  It was somewhat difficult to navigate the orifice but I eventually got into the distal ureter where the stone was seen.  I could not grasped and extracted into the bladder with just the engage basket.  I then used a nitinol basket.  This could be more easily grasped but it was difficult to navigate through the ureterovesical junction.  It was my thought at this point that the stone was still in as the stone became dislodged.  I then got the 27 m laser fiber, and replaced the scope in the distal ureter.  However, it appeared that the stone had been brought into the bladder and the using laser was unnecessary.  The stone was visualized in the bladder, and I removed it with the cystoscope.  At this point, having navigated the entire ureter with the ureteroscope and no further abnormality seen, I placed a double-J stent using cystoscopic guidance.  26 cm x 6 French contour stent with tether removed was placed.  Adequate proximal distal curls were seen with fluoroscopy and cystoscopy, respectively.  The bladder was drained, the scope removed and the procedure terminated.  The patient was awakened and taken to the PACU in stable condition having tolerated procedure  well.

## 2020-03-12 NOTE — Anesthesia Postprocedure Evaluation (Signed)
Anesthesia Post Note  Patient: Zachary Morrison  Procedure(s) Performed: CYSTOSCOPY WITH RETROGRADE PYELOGRAM, URETEROSCOPY, STONE EXTRACTION  AND DOUBLE J STENT PLACEMENT (Left Ureter)     Patient location during evaluation: PACU Anesthesia Type: General Level of consciousness: awake and alert Pain management: pain level controlled Vital Signs Assessment: post-procedure vital signs reviewed and stable Respiratory status: spontaneous breathing, nonlabored ventilation, respiratory function stable and patient connected to nasal cannula oxygen Cardiovascular status: blood pressure returned to baseline and stable Postop Assessment: no apparent nausea or vomiting Anesthetic complications: no   No complications documented.  Last Vitals:  Vitals:   03/12/20 0904 03/12/20 0926  BP: (!) 142/77 (!) 146/77  Pulse: 93 93  Resp:  12  Temp: 36.4 C (!) 36.4 C  SpO2: 95% 95%    Last Pain:  Vitals:   03/12/20 0926  TempSrc:   PainSc: 0-No pain                 Effie Berkshire

## 2020-03-12 NOTE — Discharge Instructions (Signed)

## 2020-03-12 NOTE — Anesthesia Preprocedure Evaluation (Signed)
Anesthesia Evaluation  Patient identified by MRN, date of birth, ID band Patient awake    Reviewed: Allergy & Precautions, NPO status , Patient's Chart, lab work & pertinent test results  Airway Mallampati: I  TM Distance: >3 FB Neck ROM: Full    Dental  (+) Edentulous Upper, Edentulous Lower   Pulmonary    breath sounds clear to auscultation       Cardiovascular negative cardio ROS   Rhythm:Regular Rate:Normal     Neuro/Psych Anxiety negative neurological ROS     GI/Hepatic Neg liver ROS, hiatal hernia,   Endo/Other  negative endocrine ROS  Renal/GU negative Renal ROS     Musculoskeletal  (+) Arthritis ,   Abdominal Normal abdominal exam  (+)   Peds  Hematology negative hematology ROS (+)   Anesthesia Other Findings   Reproductive/Obstetrics                            Anesthesia Physical Anesthesia Plan  ASA: II  Anesthesia Plan: General   Post-op Pain Management:    Induction: Intravenous  PONV Risk Score and Plan: 3 and Ondansetron, Dexamethasone and Midazolam  Airway Management Planned: LMA  Additional Equipment: None  Intra-op Plan:   Post-operative Plan: Extubation in OR  Informed Consent: I have reviewed the patients History and Physical, chart, labs and discussed the procedure including the risks, benefits and alternatives for the proposed anesthesia with the patient or authorized representative who has indicated his/her understanding and acceptance.       Plan Discussed with: CRNA  Anesthesia Plan Comments:        Anesthesia Quick Evaluation

## 2020-03-13 ENCOUNTER — Encounter (HOSPITAL_COMMUNITY): Payer: Self-pay | Admitting: Urology

## 2020-03-20 ENCOUNTER — Other Ambulatory Visit: Payer: Self-pay

## 2020-03-20 ENCOUNTER — Ambulatory Visit (INDEPENDENT_AMBULATORY_CARE_PROVIDER_SITE_OTHER): Payer: Medicare HMO

## 2020-03-20 DIAGNOSIS — N200 Calculus of kidney: Secondary | ICD-10-CM | POA: Diagnosis not present

## 2020-03-20 DIAGNOSIS — R319 Hematuria, unspecified: Secondary | ICD-10-CM

## 2020-03-20 LAB — URINALYSIS, ROUTINE W REFLEX MICROSCOPIC
Bilirubin, UA: NEGATIVE
Glucose, UA: NEGATIVE
Nitrite, UA: NEGATIVE
Specific Gravity, UA: 1.025 (ref 1.005–1.030)
Urobilinogen, Ur: 1 mg/dL (ref 0.2–1.0)
pH, UA: 5.5 (ref 5.0–7.5)

## 2020-03-20 LAB — MICROSCOPIC EXAMINATION
Bacteria, UA: NONE SEEN
Epithelial Cells (non renal): NONE SEEN /hpf (ref 0–10)
RBC, Urine: >30 /hpf — AB (ref 0–2)
Renal Epithel, UA: NONE SEEN /hpf

## 2020-03-20 LAB — BLADDER SCAN AMB NON-IMAGING: Scan Result: 50

## 2020-03-20 NOTE — Progress Notes (Signed)
Bladder Scan Patient can void: 50 ml Performed By: Estill Bamberg RN   Patient had ureteral stent placed on 03/12/2020 by Dr. Diona Fanti. Pt reports he has had persistent bleeding and yesterday had difficulty with voiding and then " filled the toilet with blood and blood clots" c/o generalized lower back pain with urination.  Spoke with Dr Jeffie Pollock- PVR obtained to make sure pt does not have retention from clots. Urine sample also obtained a urine for culture.   Pt given advice to use heating pad for his back ache and increase fluid intake. Pt voiced understanding.

## 2020-03-20 NOTE — Addendum Note (Signed)
Addended by: Valentina Lucks on: 03/20/2020 04:19 PM   Modules accepted: Orders

## 2020-03-23 ENCOUNTER — Encounter (INDEPENDENT_AMBULATORY_CARE_PROVIDER_SITE_OTHER): Payer: Self-pay | Admitting: Gastroenterology

## 2020-03-23 LAB — URINE CULTURE: Organism ID, Bacteria: NO GROWTH

## 2020-03-31 ENCOUNTER — Telehealth: Payer: Self-pay

## 2020-03-31 NOTE — Telephone Encounter (Signed)
Called pt left message to call back

## 2020-03-31 NOTE — Telephone Encounter (Signed)
-----   Message from Franchot Gallo, MD sent at 03/31/2020  9:38 AM EST ----- Pt has stent--likley the cause of his bleeding. Call to check on him/reassure ----- Message ----- From: Valentina Lucks, LPN Sent: [id number]   1:30 PM EST To: Franchot Gallo, MD  This pt came in Friday saying he was bleeding and back hurting. Dr. Jeffie Pollock did a PVR and Ua and this is result. I notified pt of result and he said he is still bleeding and hurting across his back esp when he urinates. Pls advise.

## 2020-04-06 NOTE — Telephone Encounter (Signed)
Pt scheduled on 12/7 to see dr.

## 2020-04-07 ENCOUNTER — Ambulatory Visit (INDEPENDENT_AMBULATORY_CARE_PROVIDER_SITE_OTHER): Payer: Medicare HMO | Admitting: Urology

## 2020-04-07 ENCOUNTER — Other Ambulatory Visit: Payer: Self-pay

## 2020-04-07 VITALS — BP 153/75 | HR 96 | Temp 99.0°F | Ht 72.0 in | Wt 221.6 lb

## 2020-04-07 DIAGNOSIS — N200 Calculus of kidney: Secondary | ICD-10-CM

## 2020-04-07 DIAGNOSIS — N390 Urinary tract infection, site not specified: Secondary | ICD-10-CM | POA: Diagnosis not present

## 2020-04-07 LAB — URINALYSIS, ROUTINE W REFLEX MICROSCOPIC
Bilirubin, UA: NEGATIVE
Glucose, UA: NEGATIVE
Ketones, UA: NEGATIVE
Nitrite, UA: NEGATIVE
Specific Gravity, UA: 1.025 (ref 1.005–1.030)
Urobilinogen, Ur: 0.2 mg/dL (ref 0.2–1.0)
pH, UA: 5 (ref 5.0–7.5)

## 2020-04-07 LAB — MICROSCOPIC EXAMINATION
Bacteria, UA: NONE SEEN
Epithelial Cells (non renal): NONE SEEN /hpf (ref 0–10)
RBC, Urine: >30 /hpf — AB (ref 0–2)
Renal Epithel, UA: NONE SEEN /hpf

## 2020-04-07 MED ORDER — CIPROFLOXACIN HCL 500 MG PO TABS
500.0000 mg | ORAL_TABLET | Freq: Once | ORAL | Status: AC
  Administered 2020-04-07: 500 mg via ORAL

## 2020-04-07 NOTE — Progress Notes (Signed)
Urological Symptom Review  Patient is experiencing the following symptoms: Burning/pain with urination Get up at night to urinate Leakage of urine Stream starts and stops Blood in urine Injury to kidneys/bladder Erection problems (male only)  Kidney stones   Review of Systems  Gastrointestinal (upper)  : Negative for upper GI symptoms  Gastrointestinal (lower) : Negative for lower GI symptoms  Constitutional : Night Sweats Fatigue  Skin: Negative for skin symptoms  Eyes: Negative for eye symptoms  Ear/Nose/Throat : Sinus problems  Hematologic/Lymphatic: Negative for Hematologic/Lymphatic symptoms  Cardiovascular : Negative for cardiovascular symptoms  Respiratory : Shortness of breath  Endocrine: Negative for endocrine symptoms  Musculoskeletal: Back pain Joint pain  Neurological: Negative for neurological symptoms  Psychologic: Negative for psychiatric symptoms

## 2020-04-07 NOTE — Progress Notes (Signed)
H&P  Chief Complaint: Gross Hematuria  History of Present Illness:   12.7.2021: Follow-up for recent left ureteroscopy and stent placement.  He reports significant pain in his back with urination and persistent gross hematuria. He reports that his hematuria will become worse following vigorous activity but has mostly resolved during the past week.  (below copied from [place] records):  9.28.2021: Pt is here for f/u of hematuria after hospitalization. Pt reports that he has a history of kidney stones including 3 surgeries (sounds like ureteroscopies). Pt first noticed blood in his urine approximately 2 years ago and went to the hospital when the "bleeding got heavy". During this period, he experienced hematuria more than once per month and that these episodes were accompanied with dysuria and flank pain (R). Pt reports no gross hematuria following hospitalization. Pt not currently taking aspirin or any other anticoagulents.  Past Medical History:  Diagnosis Date  . Anxiety   . Arthritis   . Asbestos exposure   . DOE (dyspnea on exertion)    history of asbestos exposure  . Hematuria   . History of hiatal hernia   . History of kidney stones     Past Surgical History:  Procedure Laterality Date  . COLONOSCOPY    . CYSTOSCOPY WITH RETROGRADE PYELOGRAM, URETEROSCOPY AND STENT PLACEMENT Left 03/12/2020   Procedure: CYSTOSCOPY WITH RETROGRADE PYELOGRAM, URETEROSCOPY, STONE EXTRACTION  AND DOUBLE J STENT PLACEMENT;  Surgeon: Zachary Gallo, MD;  Location: [facility];  Service: Urology;  Laterality: Left;  45 MINS  . KNEE ARTHROSCOPY Left   . LITHOTRIPSY     three times    Home Medications:  Allergies as of 04/07/2020   No Known Allergies     Medication List       Accurate as of April 07, 2020 12:07 PM. If you have any questions, ask your nurse or doctor.        acetaminophen 500 MG tablet Commonly known as: TYLENOL Take 500 mg by mouth every 6 (six) hours as needed for moderate  pain or headache.   B-12 PO Take 1 capsule by mouth daily.   Breo Ellipta 100-25 MCG/INH Aepb Generic drug: fluticasone furoate-vilanterol Inhale 1 puff into the lungs daily.   cephALEXin 500 MG capsule Commonly known as: Keflex Take 1 capsule (500 mg total) by mouth 2 (two) times daily.   LUBRICATING EYE DROPS OP Place 1 drop into both eyes daily as needed (dry eyes).   OVER THE COUNTER MEDICATION Apply 1 application topically daily as needed (stiffness). Triderma otc cream   oxybutynin 5 MG tablet Commonly known as: DITROPAN Take 1 tablet (5 mg total) by mouth every 8 (eight) hours as needed for up to 15 doses for bladder spasms.       Allergies: No Known Allergies  No family history on file.  Social History:  reports that he has never smoked. He has never used smokeless tobacco. He reports previous alcohol use. He reports that he does not use drugs.  ROS: A complete review of systems was performed.  All systems are negative except for pertinent findings as noted.  Physical Exam:  Vital signs in last 24 hours: There were no vitals taken for this visit. Constitutional:  Alert and oriented, No acute distress Cardiovascular: Regular rate  Respiratory: Normal respiratory effort Neurologic: Grossly intact, no focal deficits Psychiatric: Normal mood and affect  I have reviewed prior pt notes  I have reviewed notes from referring/previous physicians  I have reviewed urinalysis results  I have independently reviewed prior imaging  Cystoscopy Procedure Note:  Indication: Stent extraction  After informed consent and discussion of the procedure and its risks, Zachary Morrison was positioned and prepped in the standard fashion.  Cystoscopy was performed with a flexible cystoscope.   Findings: Urethra: Normal Prostate: Elongated prostatic urethra secondary to BPH Bladder neck: Normal Ureteral orifices: Difficult to see because of some hematuria, but normally  positioned Bladder: Normal urothelium The left ureteral stent was grasped and extracted without difficulty  The patient tolerated the procedure well.      Impression/Assessment:  Left ureteral stone, status post ureteroscopic extraction, stent placement with stent extraction today  Plan:  1. Pt cystoscopic stent removal covered by cipro today.  2. F/U in 3 weeks for OV, ultrasound of his kidney, and symptom recheck.  CC: Dr. Ledell Peoples

## 2020-05-15 ENCOUNTER — Ambulatory Visit (HOSPITAL_COMMUNITY)
Admission: RE | Admit: 2020-05-15 | Discharge: 2020-05-15 | Disposition: A | Discharge status: Home / Self Care | Payer: Medicare HMO | Source: Ambulatory Visit | Attending: Urology | Admitting: Urology

## 2020-05-15 ENCOUNTER — Other Ambulatory Visit: Payer: Self-pay

## 2020-05-15 DIAGNOSIS — N200 Calculus of kidney: Secondary | ICD-10-CM | POA: Diagnosis present

## 2020-05-18 ENCOUNTER — Telehealth: Payer: Self-pay

## 2020-05-18 NOTE — Telephone Encounter (Signed)
-----   Message from Franchot Gallo, MD sent at 05/18/2020 12:53 PM EST ----- Notify pt--good news U/S showed nml kidneys--no blockage following stone Rx ----- Message ----- From: Dorisann Frames, RN Sent: 05/15/2020   4:32 PM EST To: Franchot Gallo, MD  Please review

## 2020-05-18 NOTE — Telephone Encounter (Signed)
Patient notified of results- pt rescheduled appt due to weather to 06/09/2020

## 2020-05-19 ENCOUNTER — Ambulatory Visit: Payer: Medicare HMO | Admitting: Urology

## 2020-06-09 ENCOUNTER — Encounter: Payer: Self-pay | Admitting: Urology

## 2020-06-09 ENCOUNTER — Other Ambulatory Visit: Payer: Self-pay

## 2020-06-09 ENCOUNTER — Ambulatory Visit (INDEPENDENT_AMBULATORY_CARE_PROVIDER_SITE_OTHER): Payer: Medicare HMO | Admitting: Urology

## 2020-06-09 VITALS — BP 150/75 | HR 99 | Temp 98.1°F | Ht 72.0 in | Wt 186.0 lb

## 2020-06-09 DIAGNOSIS — N5201 Erectile dysfunction due to arterial insufficiency: Secondary | ICD-10-CM | POA: Diagnosis not present

## 2020-06-09 DIAGNOSIS — R1011 Right upper quadrant pain: Secondary | ICD-10-CM | POA: Diagnosis not present

## 2020-06-09 DIAGNOSIS — N200 Calculus of kidney: Secondary | ICD-10-CM | POA: Diagnosis not present

## 2020-06-09 LAB — URINALYSIS, ROUTINE W REFLEX MICROSCOPIC
Bilirubin, UA: NEGATIVE
Glucose, UA: NEGATIVE
Ketones, UA: NEGATIVE
Leukocytes,UA: NEGATIVE
Nitrite, UA: NEGATIVE
Protein,UA: NEGATIVE
Specific Gravity, UA: 1.02 (ref 1.005–1.030)
Urobilinogen, Ur: 0.2 mg/dL (ref 0.2–1.0)
pH, UA: 5 (ref 5.0–7.5)

## 2020-06-09 LAB — MICROSCOPIC EXAMINATION
Renal Epithel, UA: NONE SEEN /hpf
WBC, UA: NONE SEEN /hpf (ref 0–5)

## 2020-06-09 MED ORDER — SILDENAFIL CITRATE 100 MG PO TABS: 100.0000 mg | ORAL_TABLET | Freq: Every day | ORAL | 99 refills | Status: DC | PRN

## 2020-06-09 NOTE — Progress Notes (Signed)
Urological Symptom Review  Patient is experiencing the following symptoms: Frequent urination Hard to postpone urination Get up at night to urinate Erection problems (male only)   Review of Systems  Gastrointestinal (upper)  : None Gastrointestinal (lower) : Negative for lower GI symptoms  Constitutional : Negative for symptoms  Skin: Negative for skin symptoms  Eyes: Negative for eye symptoms  Ear/Nose/Throat : Sinus problems  Hematologic/Lymphatic: Negative for Hematologic/Lymphatic symptoms  Cardiovascular : Negative for cardiovascular symptoms  Respiratory : Shortness of breath  Endocrine: Negative for endocrine symptoms  Musculoskeletal: Negative for musculoskeletal symptoms  Neurological: Negative for neurological symptoms  Psychologic: Negative for psychiatric symptoms

## 2020-06-09 NOTE — Progress Notes (Signed)
H&P  Chief Complaint: Hx kidney stones  History of Present Illness:   9.28.2021: Pt is here for f/u of hematuriaafterhospitalization. Pt reports that he has a history of kidney stones including 3 surgeries(sounds like ureteroscopies). Pt first noticed blood in his urine approximately 2 years ago and went to the hospital when the "bleeding got heavy". During this period, he experienced hematuria more than once per month and that these episodes were accompanied with dysuria and flank pain (R). Pt reports no gross hematuria following hospitalization. Pt not currently taking aspirin or any other anticoagulents.  12.7.2021: Follow-up for recent left ureteroscopy and stent placement.  He reports significant pain in his back with urination and persistent gross hematuria. He reports that his hematuria will become worse following vigorous activity but has mostly resolved during the past week.  [name] is here today for f/u of his recent ureteroscopy. He reports persistent, but intermittent pain in his RLQ/right flank (ongoing for last 3-4 years). The pain will present in 5 minute episodes and cannot be reproduced w/ palpation.   He denies any recent gross hematuria but notes a small amount of blood in his urine following stent removal.  IPSS Questionnaire (AUA-7): Over the past month.   1)  How often have you had a sensation of not emptying your bladder completely after you finish urinating?  3 - About half the time  2)  How often have you had to urinate again less than two hours after you finished urinating? 1 - Less than 1 time in 5  3)  How often have you found you stopped and started again several times when you urinated?  2 - Less than half the time  4) How difficult have you found it to postpone urination?  5 - Almost always  5) How often have you had a weak urinary stream?  3 - About half the time  6) How often have you had to push or strain to begin urination?  0 - Not at all  7) How  many times did you most typically get up to urinate from the time you went to bed until the time you got up in the morning?  2 - 2 times  Total score:  0-7 mildly symptomatic   8-19 moderately symptomatic   20-35 severely symptomatic  IPSS: 16 QoL Score: 4    Past Medical History:  Diagnosis Date  . Anxiety   . Arthritis   . Asbestos exposure   . DOE (dyspnea on exertion)    history of asbestos exposure  . Hematuria   . History of hiatal hernia   . History of kidney stones     Past Surgical History:  Procedure Laterality Date  . COLONOSCOPY    . CYSTOSCOPY WITH RETROGRADE PYELOGRAM, URETEROSCOPY AND STENT PLACEMENT Left 03/12/2020   Procedure: CYSTOSCOPY WITH RETROGRADE PYELOGRAM, URETEROSCOPY, STONE EXTRACTION  AND DOUBLE J STENT PLACEMENT;  Surgeon: Franchot Gallo, MD;  Location: [facility];  Service: Urology;  Laterality: Left;  45 MINS  . KNEE ARTHROSCOPY Left   . LITHOTRIPSY     three times    Home Medications:  Allergies as of 06/09/2020   No Known Allergies     Medication List       Accurate as of June 09, 2020  1:39 PM. If you have any questions, ask your nurse or doctor.        acetaminophen 500 MG tablet Commonly known as: TYLENOL Take 500 mg by mouth every 6 (  six) hours as needed for moderate pain or headache.   B-12 PO Take 1 capsule by mouth daily.   Breo Ellipta 100-25 MCG/INH Aepb Generic drug: fluticasone furoate-vilanterol Inhale 1 puff into the lungs daily.   cephALEXin 500 MG capsule Commonly known as: Keflex Take 1 capsule (500 mg total) by mouth 2 (two) times daily.   LUBRICATING EYE DROPS OP Place 1 drop into both eyes daily as needed (dry eyes).   OVER THE COUNTER MEDICATION Apply 1 application topically daily as needed (stiffness). Triderma otc cream   oxybutynin 5 MG tablet Commonly known as: DITROPAN Take 1 tablet (5 mg total) by mouth every 8 (eight) hours as needed for up to 15 doses for bladder spasms.        Allergies: No Known Allergies  No family history on file.  Social History:  reports that he has never smoked. He has never used smokeless tobacco. He reports previous alcohol use. He reports that he does not use drugs.  ROS: A complete review of systems was performed.  All systems are negative except for pertinent findings as noted.  Physical Exam:  Vital signs in last 24 hours: There were no vitals taken for this visit. Constitutional:  Alert and oriented, No acute distress Cardiovascular: Regular rate  Neurologic: Grossly intact, no focal deficits Psychiatric: Normal mood and affect  I have reviewed prior pt notes  I have reviewed notes from referring/previous physicians  I have reviewed urinalysis results  I have independently reviewed prior imaging--U/S and CT images reviewed     Impression/Assessment:  Rt sided abdominal pain--non urologic source  Urolithiasis--resolved  ED--pt desires new Rx  Plan:  1. Pt reassured about CT scan and non-urologic source for his abdominal pain.  2. Pt provided w/ order for 24 hour urine collection - results will be sent in mail.  3. F/U w/ Dr. Teryl Lucy regarding abdominal pain.  4. F/U PRN  5. Sildenafil sent in  CC: Dr. Ledell Peoples

## 2020-06-18 ENCOUNTER — Encounter (INDEPENDENT_AMBULATORY_CARE_PROVIDER_SITE_OTHER): Payer: Self-pay | Admitting: Gastroenterology

## 2020-06-18 ENCOUNTER — Ambulatory Visit (INDEPENDENT_AMBULATORY_CARE_PROVIDER_SITE_OTHER): Payer: Medicare HMO | Admitting: Gastroenterology

## 2020-06-18 ENCOUNTER — Telehealth (INDEPENDENT_AMBULATORY_CARE_PROVIDER_SITE_OTHER): Payer: Self-pay

## 2020-06-18 ENCOUNTER — Encounter (INDEPENDENT_AMBULATORY_CARE_PROVIDER_SITE_OTHER): Payer: Self-pay

## 2020-06-18 ENCOUNTER — Other Ambulatory Visit: Payer: Self-pay

## 2020-06-18 DIAGNOSIS — R1031 Right lower quadrant pain: Secondary | ICD-10-CM | POA: Diagnosis not present

## 2020-06-18 DIAGNOSIS — R109 Unspecified abdominal pain: Secondary | ICD-10-CM | POA: Insufficient documentation

## 2020-06-18 DIAGNOSIS — K625 Hemorrhage of anus and rectum: Secondary | ICD-10-CM

## 2020-06-18 DIAGNOSIS — Z1211 Encounter for screening for malignant neoplasm of colon: Secondary | ICD-10-CM

## 2020-06-18 MED ORDER — DICYCLOMINE HCL 10 MG PO CAPS: 10.0000 mg | ORAL_CAPSULE | Freq: Two times a day (BID) | ORAL | 2 refills | Status: DC | PRN

## 2020-06-18 MED ORDER — PEG 3350-KCL-NA BICARB-NACL 420 G PO SOLR: 4000.0000 mL | ORAL | 0 refills | Status: DC

## 2020-06-18 NOTE — H&P (View-Only) (Signed)
Maylon Peppers, M.D. Gastroenterology & Hepatology [facility] [address] [place], [place] [address] Primary Care Physician: Andres Shad, MD [address] [place] [address]  Referring MD: PCP  Chief Complaint: Abdominal pain  History of Present Illness: Zachary Morrison is a 70 y.o. male with past medical history of anxiety, arthritis, who presents for evaluation of abdominal pain.  Patient reports that since 2018 he has presented intermittent episodes of R flank and RUQ pain described as a dsharp pain with bsome episodes of burning sensation occasionally. There is no particular time of the day when the pain happens or any specific type of food that triggers it, food intake does not trigger it. Patient has presented some episodes of pain that wake him up during the night but this is very occasional. Has very occasional episodes of nausea w/o vomiting. Sometimes he feels bloating in the right side of his abdomen and has seen his abdomen is slightly different than the right side compared to the left side.  He denies having shingles or rash in his abdomen in the past.  The patient reports that yesterday he saw some fresh blood in his stool, but this has not happened in many years prior.  The patient denies having any fever, chills, melena, hematemesis, abdominal distention, diarrhea, jaundice, pruritus or weight loss.  CT abdomen and pelvis with IV contrast 02/14/2020 - IMPRESSION: 1. The distal left ureter is patulous, with a somewhat nodular and web-like filling defect in the distal third of the ureter, difficult to accurately measure although approximately 5-6 mm in size and is located approximately 5 cm proximal to the ureterovesicular junction. This may reflect clot within the ureter although issomewhat concerning for urothelial malignancy. 2. No evidence of calculus, mass, or other filling defect.  The patient follows  with urology for symptomatic urolithiasis.  Last EGD: never Last Colonoscopy: 10 years ago, normal per the patient  FHx: neg for any gastrointestinal/liver disease, sister had cancer of unknown origin Social: quit smoking 1999, quit drinking alcohol in 2010 but used to drink very seldom, smoked marihuana many years ago Surgical: hernia repair in abdomen  Past Medical History: Past Medical History:  Diagnosis Date  . Anxiety   . Arthritis   . Asbestos exposure   . DOE (dyspnea on exertion)    history of asbestos exposure  . Hematuria   . History of hiatal hernia   . History of kidney stones     Past Surgical History: Past Surgical History:  Procedure Laterality Date  . COLONOSCOPY    . CYSTOSCOPY WITH RETROGRADE PYELOGRAM, URETEROSCOPY AND STENT PLACEMENT Left 03/12/2020   Procedure: CYSTOSCOPY WITH RETROGRADE PYELOGRAM, URETEROSCOPY, STONE EXTRACTION  AND DOUBLE J STENT PLACEMENT;  Surgeon: Franchot Gallo, MD;  Location: [facility];  Service: Urology;  Laterality: Left;  45 MINS  . KNEE ARTHROSCOPY Left   . LITHOTRIPSY     three times    Family History: Family History  Problem Relation Age of Onset  . Diabetes Father   . Heart disease Father   . Healthy Sister   . Healthy Brother   . Cancer Sister   . Healthy Brother   . Hypertension Brother     Social History: Social History   Tobacco Use  Smoking Status Never Smoker  Smokeless Tobacco Never Used   Social History   Substance and Sexual Activity  Alcohol Use Not Currently   Social History   Substance and Sexual Activity  Drug Use Never    Allergies: No Known Allergies  Medications: Current Outpatient Medications  Medication Sig Dispense Refill  . acetaminophen (TYLENOL) 500 MG tablet Take 500 mg by mouth every 6 (six) hours as needed for moderate pain or headache.    . Carboxymethylcellul-Glycerin (LUBRICATING EYE DROPS OP) Place 1 drop into both eyes daily as needed (dry eyes).    . fluticasone  furoate-vilanterol (BREO ELLIPTA) 100-25 MCG/INH AEPB Inhale 1 puff into the lungs daily.    Marland Kitchen OVER THE COUNTER MEDICATION Apply 1 application topically daily as needed (stiffness). Triderma otc cream    . sildenafil (VIAGRA) 100 MG tablet Take 1 tablet (100 mg total) by mouth daily as needed for erectile dysfunction. Take 1/2 to 1 tablet po prn 30 tablet prn  . Cyanocobalamin (B-12 PO) Take 1 capsule by mouth daily. (Patient not taking: Reported on 06/18/2020)    . oxybutynin (DITROPAN) 5 MG tablet Take 1 tablet (5 mg total) by mouth every 8 (eight) hours as needed for up to 15 doses for bladder spasms. (Patient not taking: Reported on 06/18/2020) 30 tablet 1   No current facility-administered medications for this visit.    Review of Systems: GENERAL: negative for malaise, night sweats HEENT: No changes in hearing or vision, no nose bleeds or other nasal problems. NECK: Negative for lumps, goiter, pain and significant neck swelling RESPIRATORY: Negative for cough, wheezing CARDIOVASCULAR: Negative for chest pain, leg swelling, palpitations, orthopnea GI: SEE HPI MUSCULOSKELETAL: Negative for joint pain or swelling, back pain, and muscle pain. SKIN: Negative for lesions, rash PSYCH: Negative for sleep disturbance, mood disorder and recent psychosocial stressors. HEMATOLOGY Negative for prolonged bleeding, bruising easily, and swollen nodes. ENDOCRINE: Negative for cold or heat intolerance, polyuria, polydipsia and goiter. NEURO: negative for tremor, gait imbalance, syncope and seizures. The remainder of the review of systems is noncontributory.   Physical Exam: BP 138/75 (BP Location: Left Arm, Patient Position: Sitting, Cuff Size: Large)   Pulse 87   Temp 98.5 F (36.9 C) (Oral)   Ht 6' (1.829 m)   Wt 223 lb (101.2 kg)   BMI 30.24 kg/m  GENERAL: The patient is AO x3, in no acute distress. Obese. HEENT: Head is normocephalic and atraumatic. EOMI are intact. Mouth is well hydrated and  without lesions. NECK: Supple. No masses LUNGS: Clear to auscultation. No presence of rhonchi/wheezing/rales. Adequate chest expansion HEART: RRR, normal s1 and s2. ABDOMEN: Soft, nontender, no guarding, no peritoneal signs, and nondistended. BS +. No masses. EXTREMITIES: Without any cyanosis, clubbing, rash, lesions or edema. NEUROLOGIC: AOx3, no focal motor deficit. SKIN: no jaundice, no rashes   Imaging/Labs: as above  I personally reviewed and interpreted the available labs, imaging and endoscopic files.  Impression and Plan: Zachary Morrison is a 70 y.o. male with past medical history of anxiety, arthritis, who presents for evaluation of abdominal pain.  The patient has presented chronic symptoms of abdominal pain of unclear etiology and without presence of clear triggers.  He has not presented any red flag signs besides of an isolated episode of rectal bleeding.  He had negative abdominal imaging performed recently.  I had a thorough discussion with the patient regarding the possibility of bowel hypersensitivity as the etiology pains of his symptoms but given his age, will need to proceed with an EGD with small bowel biopsies to evaluate this further.  He will benefit from implementing the intake of Bentyl as needed to decrease his abdominal pain.  On the other hand,  he is due for colorectal cancer screening, for which I will order a colonoscopy.    More than 50% of the office visit was dedicated to discussing the procedure, including the day of and risks involved. Patient understands what the procedure involves including the benefits and any risks. Patient understands alternatives to the proposed procedure. Risks including (but not limited to) bleeding, tearing of the lining (perforation), rupture of adjacent organs, problems with heart and lung function, infection, and medication reactions. A small percentage of complications may require surgery, hospitalization, repeat endoscopic procedure,  and/or transfusion.   -Schedule EGD and colonoscopy -Start Bentyl1 tablet q12h as needed for abdominal pain  All questions were answered.      Maylon Peppers, MD Gastroenterology and Hepatology [facility]

## 2020-06-18 NOTE — Progress Notes (Signed)
Maylon Peppers, M.D. Gastroenterology & Hepatology [facility] [address] [place], [place] [address] Primary Care Physician: Andres Shad, MD [address] [place] [address]  Referring MD: PCP  Chief Complaint: Abdominal pain  History of Present Illness: Zachary Morrison is a 70 y.o. male with past medical history of anxiety, arthritis, who presents for evaluation of abdominal pain.  Patient reports that since 2018 he has presented intermittent episodes of R flank and RUQ pain described as a dsharp pain with bsome episodes of burning sensation occasionally. There is no particular time of the day when the pain happens or any specific type of food that triggers it, food intake does not trigger it. Patient has presented some episodes of pain that wake him up during the night but this is very occasional. Has very occasional episodes of nausea w/o vomiting. Sometimes he feels bloating in the right side of his abdomen and has seen his abdomen is slightly different than the right side compared to the left side.  He denies having shingles or rash in his abdomen in the past.  The patient reports that yesterday he saw some fresh blood in his stool, but this has not happened in many years prior.  The patient denies having any fever, chills, melena, hematemesis, abdominal distention, diarrhea, jaundice, pruritus or weight loss.  CT abdomen and pelvis with IV contrast 02/14/2020 - IMPRESSION: 1. The distal left ureter is patulous, with a somewhat nodular and web-like filling defect in the distal third of the ureter, difficult to accurately measure although approximately 5-6 mm in size and is located approximately 5 cm proximal to the ureterovesicular junction. This may reflect clot within the ureter although issomewhat concerning for urothelial malignancy. 2. No evidence of calculus, mass, or other filling defect.  The patient follows  with urology for symptomatic urolithiasis.  Last EGD: never Last Colonoscopy: 10 years ago, normal per the patient  FHx: neg for any gastrointestinal/liver disease, sister had cancer of unknown origin Social: quit smoking 1999, quit drinking alcohol in 2010 but used to drink very seldom, smoked marihuana many years ago Surgical: hernia repair in abdomen  Past Medical History: Past Medical History:  Diagnosis Date  . Anxiety   . Arthritis   . Asbestos exposure   . DOE (dyspnea on exertion)    history of asbestos exposure  . Hematuria   . History of hiatal hernia   . History of kidney stones     Past Surgical History: Past Surgical History:  Procedure Laterality Date  . COLONOSCOPY    . CYSTOSCOPY WITH RETROGRADE PYELOGRAM, URETEROSCOPY AND STENT PLACEMENT Left 03/12/2020   Procedure: CYSTOSCOPY WITH RETROGRADE PYELOGRAM, URETEROSCOPY, STONE EXTRACTION  AND DOUBLE J STENT PLACEMENT;  Surgeon: Franchot Gallo, MD;  Location: [facility];  Service: Urology;  Laterality: Left;  45 MINS  . KNEE ARTHROSCOPY Left   . LITHOTRIPSY     three times    Family History: Family History  Problem Relation Age of Onset  . Diabetes Father   . Heart disease Father   . Healthy Sister   . Healthy Brother   . Cancer Sister   . Healthy Brother   . Hypertension Brother     Social History: Social History   Tobacco Use  Smoking Status Never Smoker  Smokeless Tobacco Never Used   Social History   Substance and Sexual Activity  Alcohol Use Not Currently   Social History   Substance and Sexual Activity  Drug Use Never    Allergies: No Known Allergies  Medications: Current Outpatient Medications  Medication Sig Dispense Refill  . acetaminophen (TYLENOL) 500 MG tablet Take 500 mg by mouth every 6 (six) hours as needed for moderate pain or headache.    . Carboxymethylcellul-Glycerin (LUBRICATING EYE DROPS OP) Place 1 drop into both eyes daily as needed (dry eyes).    . fluticasone  furoate-vilanterol (BREO ELLIPTA) 100-25 MCG/INH AEPB Inhale 1 puff into the lungs daily.    Marland Kitchen OVER THE COUNTER MEDICATION Apply 1 application topically daily as needed (stiffness). Triderma otc cream    . sildenafil (VIAGRA) 100 MG tablet Take 1 tablet (100 mg total) by mouth daily as needed for erectile dysfunction. Take 1/2 to 1 tablet po prn 30 tablet prn  . Cyanocobalamin (B-12 PO) Take 1 capsule by mouth daily. (Patient not taking: Reported on 06/18/2020)    . oxybutynin (DITROPAN) 5 MG tablet Take 1 tablet (5 mg total) by mouth every 8 (eight) hours as needed for up to 15 doses for bladder spasms. (Patient not taking: Reported on 06/18/2020) 30 tablet 1   No current facility-administered medications for this visit.    Review of Systems: GENERAL: negative for malaise, night sweats HEENT: No changes in hearing or vision, no nose bleeds or other nasal problems. NECK: Negative for lumps, goiter, pain and significant neck swelling RESPIRATORY: Negative for cough, wheezing CARDIOVASCULAR: Negative for chest pain, leg swelling, palpitations, orthopnea GI: SEE HPI MUSCULOSKELETAL: Negative for joint pain or swelling, back pain, and muscle pain. SKIN: Negative for lesions, rash PSYCH: Negative for sleep disturbance, mood disorder and recent psychosocial stressors. HEMATOLOGY Negative for prolonged bleeding, bruising easily, and swollen nodes. ENDOCRINE: Negative for cold or heat intolerance, polyuria, polydipsia and goiter. NEURO: negative for tremor, gait imbalance, syncope and seizures. The remainder of the review of systems is noncontributory.   Physical Exam: BP 138/75 (BP Location: Left Arm, Patient Position: Sitting, Cuff Size: Large)   Pulse 87   Temp 98.5 F (36.9 C) (Oral)   Ht 6' (1.829 m)   Wt 223 lb (101.2 kg)   BMI 30.24 kg/m  GENERAL: The patient is AO x3, in no acute distress. Obese. HEENT: Head is normocephalic and atraumatic. EOMI are intact. Mouth is well hydrated and  without lesions. NECK: Supple. No masses LUNGS: Clear to auscultation. No presence of rhonchi/wheezing/rales. Adequate chest expansion HEART: RRR, normal s1 and s2. ABDOMEN: Soft, nontender, no guarding, no peritoneal signs, and nondistended. BS +. No masses. EXTREMITIES: Without any cyanosis, clubbing, rash, lesions or edema. NEUROLOGIC: AOx3, no focal motor deficit. SKIN: no jaundice, no rashes   Imaging/Labs: as above  I personally reviewed and interpreted the available labs, imaging and endoscopic files.  Impression and Plan: Zachary Morrison is a 70 y.o. male with past medical history of anxiety, arthritis, who presents for evaluation of abdominal pain.  The patient has presented chronic symptoms of abdominal pain of unclear etiology and without presence of clear triggers.  He has not presented any red flag signs besides of an isolated episode of rectal bleeding.  He had negative abdominal imaging performed recently.  I had a thorough discussion with the patient regarding the possibility of bowel hypersensitivity as the etiology pains of his symptoms but given his age, will need to proceed with an EGD with small bowel biopsies to evaluate this further.  He will benefit from implementing the intake of Bentyl as needed to decrease his abdominal pain.  On the other hand,  he is due for colorectal cancer screening, for which I will order a colonoscopy.    More than 50% of the office visit was dedicated to discussing the procedure, including the day of and risks involved. Patient understands what the procedure involves including the benefits and any risks. Patient understands alternatives to the proposed procedure. Risks including (but not limited to) bleeding, tearing of the lining (perforation), rupture of adjacent organs, problems with heart and lung function, infection, and medication reactions. A small percentage of complications may require surgery, hospitalization, repeat endoscopic procedure,  and/or transfusion.   -Schedule EGD and colonoscopy -Start Bentyl1 tablet q12h as needed for abdominal pain  All questions were answered.      Maylon Peppers, MD Gastroenterology and Hepatology [facility]

## 2020-06-18 NOTE — Telephone Encounter (Signed)
Zachary Morrison, CMA  

## 2020-06-18 NOTE — Patient Instructions (Signed)
Schedule EGD and colonoscopy ?Start Bentyl 1 tablet q12h as needed for abdominal pain ?

## 2020-06-19 ENCOUNTER — Other Ambulatory Visit (INDEPENDENT_AMBULATORY_CARE_PROVIDER_SITE_OTHER): Payer: Self-pay

## 2020-06-19 DIAGNOSIS — R1031 Right lower quadrant pain: Secondary | ICD-10-CM

## 2020-06-22 ENCOUNTER — Other Ambulatory Visit (INDEPENDENT_AMBULATORY_CARE_PROVIDER_SITE_OTHER): Payer: Self-pay | Admitting: Gastroenterology

## 2020-06-22 DIAGNOSIS — Z1211 Encounter for screening for malignant neoplasm of colon: Secondary | ICD-10-CM

## 2020-06-25 ENCOUNTER — Other Ambulatory Visit (INDEPENDENT_AMBULATORY_CARE_PROVIDER_SITE_OTHER): Payer: Self-pay | Admitting: Gastroenterology

## 2020-06-25 DIAGNOSIS — Z1211 Encounter for screening for malignant neoplasm of colon: Secondary | ICD-10-CM

## 2020-06-29 ENCOUNTER — Other Ambulatory Visit (HOSPITAL_COMMUNITY)
Admission: RE | Admit: 2020-06-29 | Discharge: 2020-06-29 | Disposition: A | Discharge status: Home / Self Care | Payer: Medicare HMO | Source: Ambulatory Visit | Attending: Gastroenterology | Admitting: Gastroenterology

## 2020-06-29 ENCOUNTER — Other Ambulatory Visit: Payer: Self-pay

## 2020-06-29 DIAGNOSIS — Z01812 Encounter for preprocedural laboratory examination: Secondary | ICD-10-CM | POA: Diagnosis present

## 2020-06-29 DIAGNOSIS — Z20822 Contact with and (suspected) exposure to covid-19: Secondary | ICD-10-CM | POA: Insufficient documentation

## 2020-06-29 LAB — SARS CORONAVIRUS 2 (TAT 6-24 HRS): SARS Coronavirus 2: NEGATIVE

## 2020-07-01 ENCOUNTER — Other Ambulatory Visit: Payer: Self-pay

## 2020-07-01 ENCOUNTER — Encounter (HOSPITAL_COMMUNITY): Payer: Self-pay | Admitting: Gastroenterology

## 2020-07-01 ENCOUNTER — Ambulatory Visit (HOSPITAL_COMMUNITY): Payer: Medicare HMO | Admitting: Anesthesiology

## 2020-07-01 ENCOUNTER — Encounter (HOSPITAL_COMMUNITY)
Admission: RE | Disposition: A | Discharge status: Home / Self Care | Payer: Self-pay | Source: Home / Self Care | Attending: Gastroenterology

## 2020-07-01 ENCOUNTER — Ambulatory Visit (HOSPITAL_COMMUNITY)
Admission: RE | Admit: 2020-07-01 | Discharge: 2020-07-01 | Disposition: A | Discharge status: Home / Self Care | Payer: Medicare HMO | Attending: Gastroenterology | Admitting: Gastroenterology

## 2020-07-01 DIAGNOSIS — M1990 Unspecified osteoarthritis, unspecified site: Secondary | ICD-10-CM | POA: Diagnosis not present

## 2020-07-01 DIAGNOSIS — K3189 Other diseases of stomach and duodenum: Secondary | ICD-10-CM | POA: Insufficient documentation

## 2020-07-01 DIAGNOSIS — D125 Benign neoplasm of sigmoid colon: Secondary | ICD-10-CM | POA: Diagnosis not present

## 2020-07-01 DIAGNOSIS — D128 Benign neoplasm of rectum: Secondary | ICD-10-CM | POA: Diagnosis not present

## 2020-07-01 DIAGNOSIS — K449 Diaphragmatic hernia without obstruction or gangrene: Secondary | ICD-10-CM | POA: Insufficient documentation

## 2020-07-01 DIAGNOSIS — R1011 Right upper quadrant pain: Secondary | ICD-10-CM

## 2020-07-01 DIAGNOSIS — Z7951 Long term (current) use of inhaled steroids: Secondary | ICD-10-CM | POA: Insufficient documentation

## 2020-07-01 DIAGNOSIS — K621 Rectal polyp: Secondary | ICD-10-CM

## 2020-07-01 DIAGNOSIS — K648 Other hemorrhoids: Secondary | ICD-10-CM | POA: Insufficient documentation

## 2020-07-01 DIAGNOSIS — Z1211 Encounter for screening for malignant neoplasm of colon: Secondary | ICD-10-CM

## 2020-07-01 HISTORY — PX: COLONOSCOPY WITH PROPOFOL: SHX5780

## 2020-07-01 HISTORY — PX: ESOPHAGOGASTRODUODENOSCOPY (EGD) WITH PROPOFOL: SHX5813

## 2020-07-01 HISTORY — PX: POLYPECTOMY: SHX149

## 2020-07-01 HISTORY — PX: BIOPSY: SHX5522

## 2020-07-01 LAB — HM COLONOSCOPY

## 2020-07-01 SURGERY — COLONOSCOPY WITH PROPOFOL
Anesthesia: General

## 2020-07-01 MED ORDER — STERILE WATER FOR IRRIGATION IR SOLN
Status: DC | PRN
  Administered 2020-07-01: 200 mL

## 2020-07-01 MED ORDER — LACTATED RINGERS IV SOLN: INTRAVENOUS | Status: DC

## 2020-07-01 MED ORDER — GLYCOPYRROLATE 0.2 MG/ML IJ SOLN: 0.2000 mg | Freq: Once | INTRAMUSCULAR | Status: DC

## 2020-07-01 MED ORDER — PROPOFOL 10 MG/ML IV BOLUS
INTRAVENOUS | Status: DC | PRN
  Administered 2020-07-01: 50 mg via INTRAVENOUS

## 2020-07-01 MED ORDER — PROPOFOL 500 MG/50ML IV EMUL
INTRAVENOUS | Status: DC | PRN
  Administered 2020-07-01: 150 ug/kg/min via INTRAVENOUS

## 2020-07-01 MED ORDER — LIDOCAINE VISCOUS HCL 2 % MT SOLN: 15.0000 mL | Freq: Once | OROMUCOSAL | Status: DC

## 2020-07-01 NOTE — Transfer of Care (Signed)
Immediate Anesthesia Transfer of Care Note  Patient: Zachary Morrison  Procedure(s) Performed: COLONOSCOPY WITH PROPOFOL (N/A ) ESOPHAGOGASTRODUODENOSCOPY (EGD) WITH PROPOFOL (N/A ) BIOPSY POLYPECTOMY INTESTINAL  Patient Location: PACU  Anesthesia Type:General  Level of Consciousness: awake, alert , oriented and patient cooperative  Airway & Oxygen Therapy: Patient Spontanous Breathing  Post-op Assessment: Report given to RN, Post -op Vital signs reviewed and stable and Patient moving all extremities X 4  Post vital signs: Reviewed and stable  Last Vitals:  Vitals Value Taken Time  BP 121/65 07/01/20 1316  Temp    Pulse 77 07/01/20 1316  Resp 19 07/01/20 1316  SpO2 95 % 07/01/20 1316    Last Pain:  Vitals:   07/01/20 1316  TempSrc: Oral  PainSc:       Patients Stated Pain Goal: 0 (74/73/40 3709)  Complications: No complications documented.

## 2020-07-01 NOTE — Anesthesia Preprocedure Evaluation (Addendum)
Anesthesia Evaluation  Patient identified by MRN, date of birth, ID band Patient awake    Reviewed: Allergy & Precautions, NPO status , Patient's Chart, lab work & pertinent test results  History of Anesthesia Complications Negative for: history of anesthetic complications  Airway Mallampati: II  TM Distance: >3 FB Neck ROM: Full    Dental  (+) Lower Dentures, Upper Dentures   Pulmonary shortness of breath and with exertion,  history of asbestos exposure   Pulmonary exam normal breath sounds clear to auscultation       Cardiovascular + DOE  Normal cardiovascular exam Rhythm:Regular Rate:Normal     Neuro/Psych Anxiety negative neurological ROS     GI/Hepatic Neg liver ROS, hiatal hernia,   Endo/Other  negative endocrine ROS  Renal/GU negative Renal ROS     Musculoskeletal  (+) Arthritis ,   Abdominal   Peds  Hematology negative hematology ROS (+)   Anesthesia Other Findings   Reproductive/Obstetrics                            Anesthesia Physical Anesthesia Plan  ASA: II  Anesthesia Plan: General   Post-op Pain Management:    Induction: Intravenous  PONV Risk Score and Plan: TIVA  Airway Management Planned: Nasal Cannula and Natural Airway  Additional Equipment:   Intra-op Plan:   Post-operative Plan:   Informed Consent: I have reviewed the patients History and Physical, chart, labs and discussed the procedure including the risks, benefits and alternatives for the proposed anesthesia with the patient or authorized representative who has indicated his/her understanding and acceptance.       Plan Discussed with: CRNA and Surgeon  Anesthesia Plan Comments:         Anesthesia Quick Evaluation

## 2020-07-01 NOTE — Interval H&P Note (Signed)
History and Physical Interval Note:  07/01/2020 11:25 AM  Zachary Morrison is a 70 y.o. male with past medical history of anxiety, arthritis, who presents for evaluation of right upper quadrant abdominal pain and colorectal cancer screening.  The patient reports that since 2018 he has presented intermittent episodes of right flank and right upper quadrant pain described as a sharp burning sensation intermittently but is present during the whole day.  Denies any nausea, vomiting, fever, chills, melena or recent hematochezia.  Had a CT of the abdomen and pelvis with IV contrast on 02/14/2020 which did not show any alterations explain his symptoms.  Last colonoscopy was performed 10 years ago which was normal per the patient, has never had an EGD in the past.  No family history of colorectal cancer.  Pulse 86   Temp 97.6 F (36.4 C) (Oral)   Resp 20   Ht 6' (1.829 m)   SpO2 98%   BMI 30.24 kg/m  GENERAL: The patient is AO x3, in no acute distress. HEENT: Head is normocephalic and atraumatic. EOMI are intact. Mouth is well hydrated and without lesions. NECK: Supple. No masses LUNGS: Clear to auscultation. No presence of rhonchi/wheezing/rales. Adequate chest expansion HEART: RRR, normal s1 and s2. ABDOMEN: mildly tender in RUQ, no guarding, no peritoneal signs, and nondistended. BS +. No masses. EXTREMITIES: Without any cyanosis, clubbing, rash, lesions or edema. NEUROLOGIC: AOx3, no focal motor deficit. SKIN: no jaundice, no rashes   Zachary Morrison  has presented today for surgery, with the diagnosis of Abdominal Pain screening colonoscopy.  The various methods of treatment have been discussed with the patient and family. After consideration of risks, benefits and other options for treatment, the patient has consented to  Procedure(s) with comments: COLONOSCOPY WITH PROPOFOL (N/A) - AM ESOPHAGOGASTRODUODENOSCOPY (EGD) WITH PROPOFOL (N/A) as a surgical intervention.  The patient's history has been  reviewed, patient examined, no change in status, stable for surgery.  I have reviewed the patient's chart and labs.  Questions were answered to the patient's satisfaction.     Zachary Morrison

## 2020-07-01 NOTE — Anesthesia Postprocedure Evaluation (Signed)
Anesthesia Post Note  Patient: Zachary Morrison  Procedure(s) Performed: COLONOSCOPY WITH PROPOFOL (N/A ) ESOPHAGOGASTRODUODENOSCOPY (EGD) WITH PROPOFOL (N/A ) BIOPSY POLYPECTOMY INTESTINAL  Patient location during evaluation: Phase II Anesthesia Type: General Level of consciousness: awake, oriented, awake and alert and patient cooperative Pain management: satisfactory to patient Vital Signs Assessment: post-procedure vital signs reviewed and stable Respiratory status: spontaneous breathing, respiratory function stable and nonlabored ventilation Cardiovascular status: stable Postop Assessment: no apparent nausea or vomiting Anesthetic complications: no   No complications documented.   Last Vitals:  Vitals:   07/01/20 1022 07/01/20 1316  BP:  121/65  Pulse: 86 77  Resp: 20 19  Temp: 36.4 C   SpO2: 98% 95%    Last Pain:  Vitals:   07/01/20 1316  TempSrc: Oral  PainSc:                  [name]

## 2020-07-01 NOTE — Discharge Instructions (Signed)
You are being discharged to home.  Resume your previous diet.  We are waiting for your pathology results.  Take Bentyl as needed for abdominal pain Your physician has recommended a repeat colonoscopy in six months for surveillance.  Perform flexible sigmoidoscopy in 6 months for surveillance, if normal then do colonoscopy in 3 years.   Upper Endoscopy, Adult, Care After This sheet gives you information about how to care for yourself after your procedure. Your health care provider may also give you more specific instructions. If you have problems or questions, contact your health care provider. What can I expect after the procedure? After the procedure, it is common to have:  A sore throat.  Mild stomach pain or discomfort.  Bloating.  Nausea. Follow these instructions at home:  Follow instructions from your health care provider about what to eat or drink after your procedure.  Return to your normal activities as told by your health care provider. Ask your health care provider what activities are safe for you.  Take over-the-counter and prescription medicines only as told by your health care provider.  If you were given a sedative during the procedure, it can affect you for several hours. Do not drive or operate machinery until your health care provider says that it is safe.  Keep all follow-up visits as told by your health care provider. This is important.   Contact a health care provider if you have:  A sore throat that lasts longer than one day.  Trouble swallowing. Get help right away if:  You vomit blood or your vomit looks like coffee grounds.  You have: ? A fever. ? Bloody, black, or tarry stools. ? A severe sore throat or you cannot swallow. ? Difficulty breathing. ? Severe pain in your chest or abdomen. Summary  After the procedure, it is common to have a sore throat, mild stomach discomfort, bloating, and nausea.  If you were given a sedative during the  procedure, it can affect you for several hours. Do not drive or operate machinery until your health care provider says that it is safe.  Follow instructions from your health care provider about what to eat or drink after your procedure.  Return to your normal activities as told by your health care provider. This information is not intended to replace advice given to you by your health care provider. Make sure you discuss any questions you have with your health care provider. Document Revised: 04/16/2019 Document Reviewed: 09/18/2017 Elsevier Patient Education  2021 [place].   Colonoscopy, Adult, Care After This sheet gives you information about how to care for yourself after your procedure. Your doctor may also give you more specific instructions. If you have problems or questions, call your doctor. What can I expect after the procedure? After the procedure, it is common to have:  A small amount of blood in your poop (stool) for 24 hours.  Some gas.  Mild cramping or bloating in your belly (abdomen). Follow these instructions at home: Eating and drinking  Drink enough fluid to keep your pee (urine) pale yellow.  Follow instructions from your doctor about what you cannot eat or drink.  Return to your normal diet as told by your doctor. Avoid heavy or fried foods that are hard to digest.   Activity  Rest as told by your doctor.  Do not sit for a long time without moving. Get up to take short walks every 1-2 hours. This is important. Ask for help if you  feel weak or unsteady.  Return to your normal activities as told by your doctor. Ask your doctor what activities are safe for you. To help cramping and bloating:  Try walking around.  Put heat on your belly as told by your doctor. Use the heat source that your doctor recommends, such as a moist heat pack or a heating pad. ? Put a towel between your skin and the heat source. ? Leave the heat on for 20-30 minutes. ? Remove  the heat if your skin turns bright red. This is very important if you are unable to feel pain, heat, or cold. You may have a greater risk of getting burned.   General instructions  If you were given a medicine to help you relax (sedative) during your procedure, it can affect you for many hours. Do not drive or use machinery until your doctor says that it is safe.  For the first 24 hours after the procedure: ? Do not sign important documents. ? Do not drink alcohol. ? Do your daily activities more slowly than normal. ? Eat foods that are soft and easy to digest.  Take over-the-counter or prescription medicines only as told by your doctor.  Keep all follow-up visits as told by your doctor. This is important. Contact a doctor if:  You have blood in your poop 2-3 days after the procedure. Get help right away if:  You have more than a small amount of blood in your poop.  You see large clumps of tissue (blood clots) in your poop.  Your belly is swollen.  You feel like you may vomit (nauseous).  You vomit.  You have a fever.  You have belly pain that gets worse, and medicine does not help your pain. Summary  After the procedure, it is common to have a small amount of blood in your poop. You may also have mild cramping and bloating in your belly.  If you were given a medicine to help you relax (sedative) during your procedure, it can affect you for many hours. Do not drive or use machinery until your doctor says that it is safe.  Get help right away if you have a lot of blood in your poop, feel like you may vomit, have a fever, or have more belly pain. This information is not intended to replace advice given to you by your health care provider. Make sure you discuss any questions you have with your health care provider. Document Revised: 02/22/2019 Document Reviewed: 11/12/2018 Elsevier Patient Education  [place].  Colon Polyps  Colon polyps are tissue growths inside  the colon, which is part of the large intestine. They are one of the types of polyps that can grow in the body. A polyp may be a round bump or a mushroom-shaped growth. You could have one polyp or more than one. Most colon polyps are noncancerous (benign). However, some colon polyps can become cancerous over time. Finding and removing the polyps early can help prevent this. What are the causes? The exact cause of colon polyps is not known. What increases the risk? The following factors may make you more likely to develop this condition:  Having a family history of colorectal cancer or colon polyps.  Being older than 71 years of age.  Being [place] than 71 years of age and having a significant family history of colorectal cancer or colon polyps or a genetic condition that puts you at higher risk of getting colon polyps.  Having inflammatory  bowel disease, such as ulcerative colitis or Crohn's disease.  Having certain conditions passed from parent to child (hereditary conditions), such as: ? Familial adenomatous polyposis (FAP). ? Lynch syndrome. ? Turcot syndrome. ? Peutz-Jeghers syndrome. ? MUTYH-associated polyposis (MAP).  Being overweight.  Certain lifestyle factors. These include smoking cigarettes, drinking too much alcohol, not getting enough exercise, and eating a diet that is high in fat and red meat and low in fiber.  Having had childhood cancer that was treated with radiation of the abdomen. What are the signs or symptoms? Many times, there are no symptoms. If you have symptoms, they may include:  Blood coming from the rectum during a bowel movement.  Blood in the stool (feces). The blood may be bright red or very dark in color.  Pain in the abdomen.  A change in bowel habits, such as constipation or diarrhea. How is this diagnosed? This condition is diagnosed with a colonoscopy. This is a procedure in which a lighted, flexible scope is inserted into the opening  between the buttocks (anus) and then passed into the colon to examine the area. Polyps are sometimes found when a colonoscopy is done as part of routine cancer screening tests. How is this treated? This condition is treated by removing any polyps that are found. Most polyps can be removed during a colonoscopy. Those polyps will then be tested for cancer. Additional treatment may be needed depending on the results of testing. Follow these instructions at home: Eating and drinking  Eat foods that are high in fiber, such as fruits, vegetables, and whole grains.  Eat foods that are high in calcium and vitamin D, such as milk, cheese, yogurt, eggs, liver, fish, and broccoli.  Limit foods that are high in fat, such as fried foods and desserts.  Limit the amount of red meat, precooked or cured meat, or other processed meat that you eat, such as hot dogs, sausages, bacon, or meat loaves.  Limit sugary drinks.   Lifestyle  Maintain a healthy weight, or lose weight if recommended by your health care provider.  Exercise every day or as told by your health care provider.  Do not use any products that contain nicotine or tobacco, such as cigarettes, e-cigarettes, and chewing tobacco. If you need help quitting, ask your health care provider.  Do not drink alcohol if: ? Your health care provider tells you not to drink. ? You are pregnant, may be pregnant, or are planning to become pregnant.  If you drink alcohol: ? Limit how much you use to:  0-1 drink a day for women.  0-2 drinks a day for men. ? Know how much alcohol is in your drink. In the [place] one drink equals one 12 oz bottle of beer (355 mL), one 5 oz glass of wine (148 mL), or one 1 oz glass of hard liquor (44 mL). General instructions  Take over-the-counter and prescription medicines only as told by your health care provider.  Keep all follow-up visits. This is important. This includes having regularly scheduled colonoscopies. Talk  to your health care provider about when you need a colonoscopy. Contact a health care provider if:  You have new or worsening bleeding during a bowel movement.  You have new or increased blood in your stool.  You have a change in bowel habits.  You lose weight for no known reason. Summary  Colon polyps are tissue growths inside the colon, which is part of the large intestine. They are one type of  polyp that can grow in the body.  Most colon polyps are noncancerous (benign), but some can become cancerous over time.  This condition is diagnosed with a colonoscopy.  This condition is treated by removing any polyps that are found. Most polyps can be removed during a colonoscopy. This information is not intended to replace advice given to you by your health care provider. Make sure you discuss any questions you have with your health care provider. Document Revised: 08/07/2019 Document Reviewed: 08/07/2019 Elsevier Patient Education  2021 [employer].

## 2020-07-01 NOTE — Op Note (Signed)
[facility] Patient Name: Zachary Morrison Procedure Date: 07/01/2020 12:24 PM MRN: [id number] Date of Birth: Sep 16, 1950 Attending MD: Maylon Peppers ,  CSN: [id number] Age: 70 Admit Type: Outpatient Procedure:                Colonoscopy Indications:              Screening for colorectal malignant neoplasm Providers:                Maylon Peppers, [place] Page, [place] Risa Grill, Technician Referring MD:              Medicines:                Monitored Anesthesia Care Complications:            No immediate complications. Estimated Blood Loss:     Estimated blood loss: none. Procedure:                Pre-Anesthesia Assessment:                           - Prior to the procedure, a History and Physical                            was performed, and patient medications, allergies                            and sensitivities were reviewed. The patient's                            tolerance of previous anesthesia was reviewed.                           - The risks and benefits of the procedure and the                            sedation options and risks were discussed with the                            patient. All questions were answered and informed                            consent was obtained.                           - ASA Grade Assessment: II - A patient with mild                            systemic disease.                           After obtaining informed consent, the colonoscope                            was passed under direct vision. Throughout the  procedure, the patient's blood pressure, pulse, and                            oxygen saturations were monitored continuously. The                            [id number]-H190DL ([id number]) scope was introduced through                            the anus and advanced to the the cecum, identified                            by appendiceal orifice and ileocecal valve. The                             colonoscopy was performed without difficulty. The                            patient tolerated the procedure well. The quality                            of the bowel preparation was good. Scope withdrawal                            time was 12 minutes. Scope In: 12:27:50 PM Scope Out: 1:10:53 PM Scope Withdrawal Time: 0 hours 32 minutes 55 seconds  Total Procedure Duration: 0 hours 43 minutes 3 seconds  Findings:      The perianal and digital rectal examinations were normal.      A 12 mm polyp was found in the sigmoid colon. The polyp was sessile. The       polyp was removed with a hot snare. Resection and retrieval were       complete.      A 25 mm polyp was found in the distal rectum. The polyp was       multi-lobulated. The polyp was removed with a piecemeal technique using       a hot snare. Resection and retrieval were complete. The edges of the       polypectomy site were ablated with soft coagulation with the tip of the       snare.      Non-bleeding internal hemorrhoids were found during retroflexion. The       hemorrhoids were small. Impression:               - One 12 mm polyp in the sigmoid colon, removed                            with a hot snare. Resected and retrieved.                           - One 25 mm polyp in the distal rectum, removed                            piecemeal using a hot snare. Resected and retrieved.                           -  Non-bleeding internal hemorrhoids. Moderate Sedation:      Per Anesthesia Care Recommendation:           - Discharge patient to home (ambulatory).                           - Resume previous diet.                           - Await pathology results.                           - Perform flexible sigmoidoscopy in 6 months for                            surveillance, if normal then do colonoscopy in 3                            years. Procedure Code(s):        --- Professional ---                            [phone], Colonoscopy, flexible; with removal of                            tumor(s), polyp(s), or other lesion(s) by snare                            technique Diagnosis Code(s):        --- Professional ---                           Z12.11, Encounter for screening for malignant                            neoplasm of colon                           K63.5, Polyp of colon                           K62.1, Rectal polyp                           K64.8, Other hemorrhoids CPT copyright 2019 American Medical Association. All rights reserved. The codes documented in this report are preliminary and upon coder review may  be revised to meet current compliance requirements. Maylon Peppers, MD Maylon Peppers,  07/01/2020 1:18:15 PM This report has been signed electronically. Number of Addenda: 0

## 2020-07-01 NOTE — Op Note (Signed)
[facility] Patient Name: Zachary Morrison Procedure Date: 07/01/2020 11:28 AM MRN: [id number] Date of Birth: 09-07-1950 Attending MD: Maylon Peppers ,  CSN: [id number] Age: 70 Admit Type: Outpatient Procedure:                Upper GI endoscopy Indications:              Abdominal pain in the right upper quadrant Providers:                Maylon Peppers, [place] Page, [place] Risa Grill, Technician Referring MD:              Medicines:                Monitored Anesthesia Care Complications:            No immediate complications. Estimated Blood Loss:     Estimated blood loss: none. Procedure:                Pre-Anesthesia Assessment:                           - Prior to the procedure, a History and Physical                            was performed, and patient medications, allergies                            and sensitivities were reviewed. The patient's                            tolerance of previous anesthesia was reviewed.                           - The risks and benefits of the procedure and the                            sedation options and risks were discussed with the                            patient. All questions were answered and informed                            consent was obtained.                           - ASA Grade Assessment: II - A patient with mild                            systemic disease.                           After obtaining informed consent, the endoscope was                            passed under direct vision. Throughout  the                            procedure, the patient's blood pressure, pulse, and                            oxygen saturations were monitored continuously. The                            [id number]-H190 ([id number]) scope was introduced through the                            mouth, and advanced to the second part of duodenum.                            The upper GI endoscopy was accomplished without                             difficulty. The patient tolerated the procedure                            well. Scope In: 12:14:53 PM Scope Out: 12:22:21 PM Total Procedure Duration: 0 hours 7 minutes 28 seconds  Findings:      The examined esophagus was normal.      A 1 cm hiatal hernia was present.      The entire examined stomach was normal. Biopsies from body and antrum       were taken with a cold forceps for Helicobacter pylori testing.      Localized mildly erythematous mucosa without active bleeding was found       in the duodenal bulb. Biopsies for histology were taken from normal       duodenumwith a cold forceps for evaluation of celiac disease. Impression:               - Normal esophagus.                           - 1 cm hiatal hernia.                           - Normal stomach. Biopsied.                           - Erythematous duodenopathy. Normal duodenum                            biopsied. Moderate Sedation:      Per Anesthesia Care Recommendation:           - Discharge patient to home (ambulatory).                           - Resume previous diet.                           - Await pathology results.                           -  Take Bentyl as needed for abdominal pain. Procedure Code(s):        --- Professional ---                           [phone], Esophagogastroduodenoscopy, flexible,                            transoral; with biopsy, single or multiple Diagnosis Code(s):        --- Professional ---                           K44.9, Diaphragmatic hernia without obstruction or                            gangrene                           K31.89, Other diseases of stomach and duodenum                           R10.11, Right upper quadrant pain CPT copyright 2019 American Medical Association. All rights reserved. The codes documented in this report are preliminary and upon coder review may  be revised to meet current compliance requirements. Maylon Peppers, MD Maylon Peppers,  07/01/2020 12:26:56 PM This report has been signed electronically. Number of Addenda: 0

## 2020-07-03 LAB — SURGICAL PATHOLOGY

## 2020-07-06 ENCOUNTER — Encounter (HOSPITAL_COMMUNITY): Payer: Self-pay | Admitting: Gastroenterology

## 2020-07-06 ENCOUNTER — Encounter (INDEPENDENT_AMBULATORY_CARE_PROVIDER_SITE_OTHER): Payer: Self-pay | Admitting: *Deleted

## 2020-12-29 ENCOUNTER — Encounter (INDEPENDENT_AMBULATORY_CARE_PROVIDER_SITE_OTHER): Payer: Self-pay | Admitting: *Deleted

## 2022-07-02 IMAGING — DX DG CHEST 2V
2 series · 2 of 2 positions shown · non-contrast
Comparison: None.

CLINICAL DATA: Chest pain

EXAM:
CHEST - 2 VIEW

[chest pa]
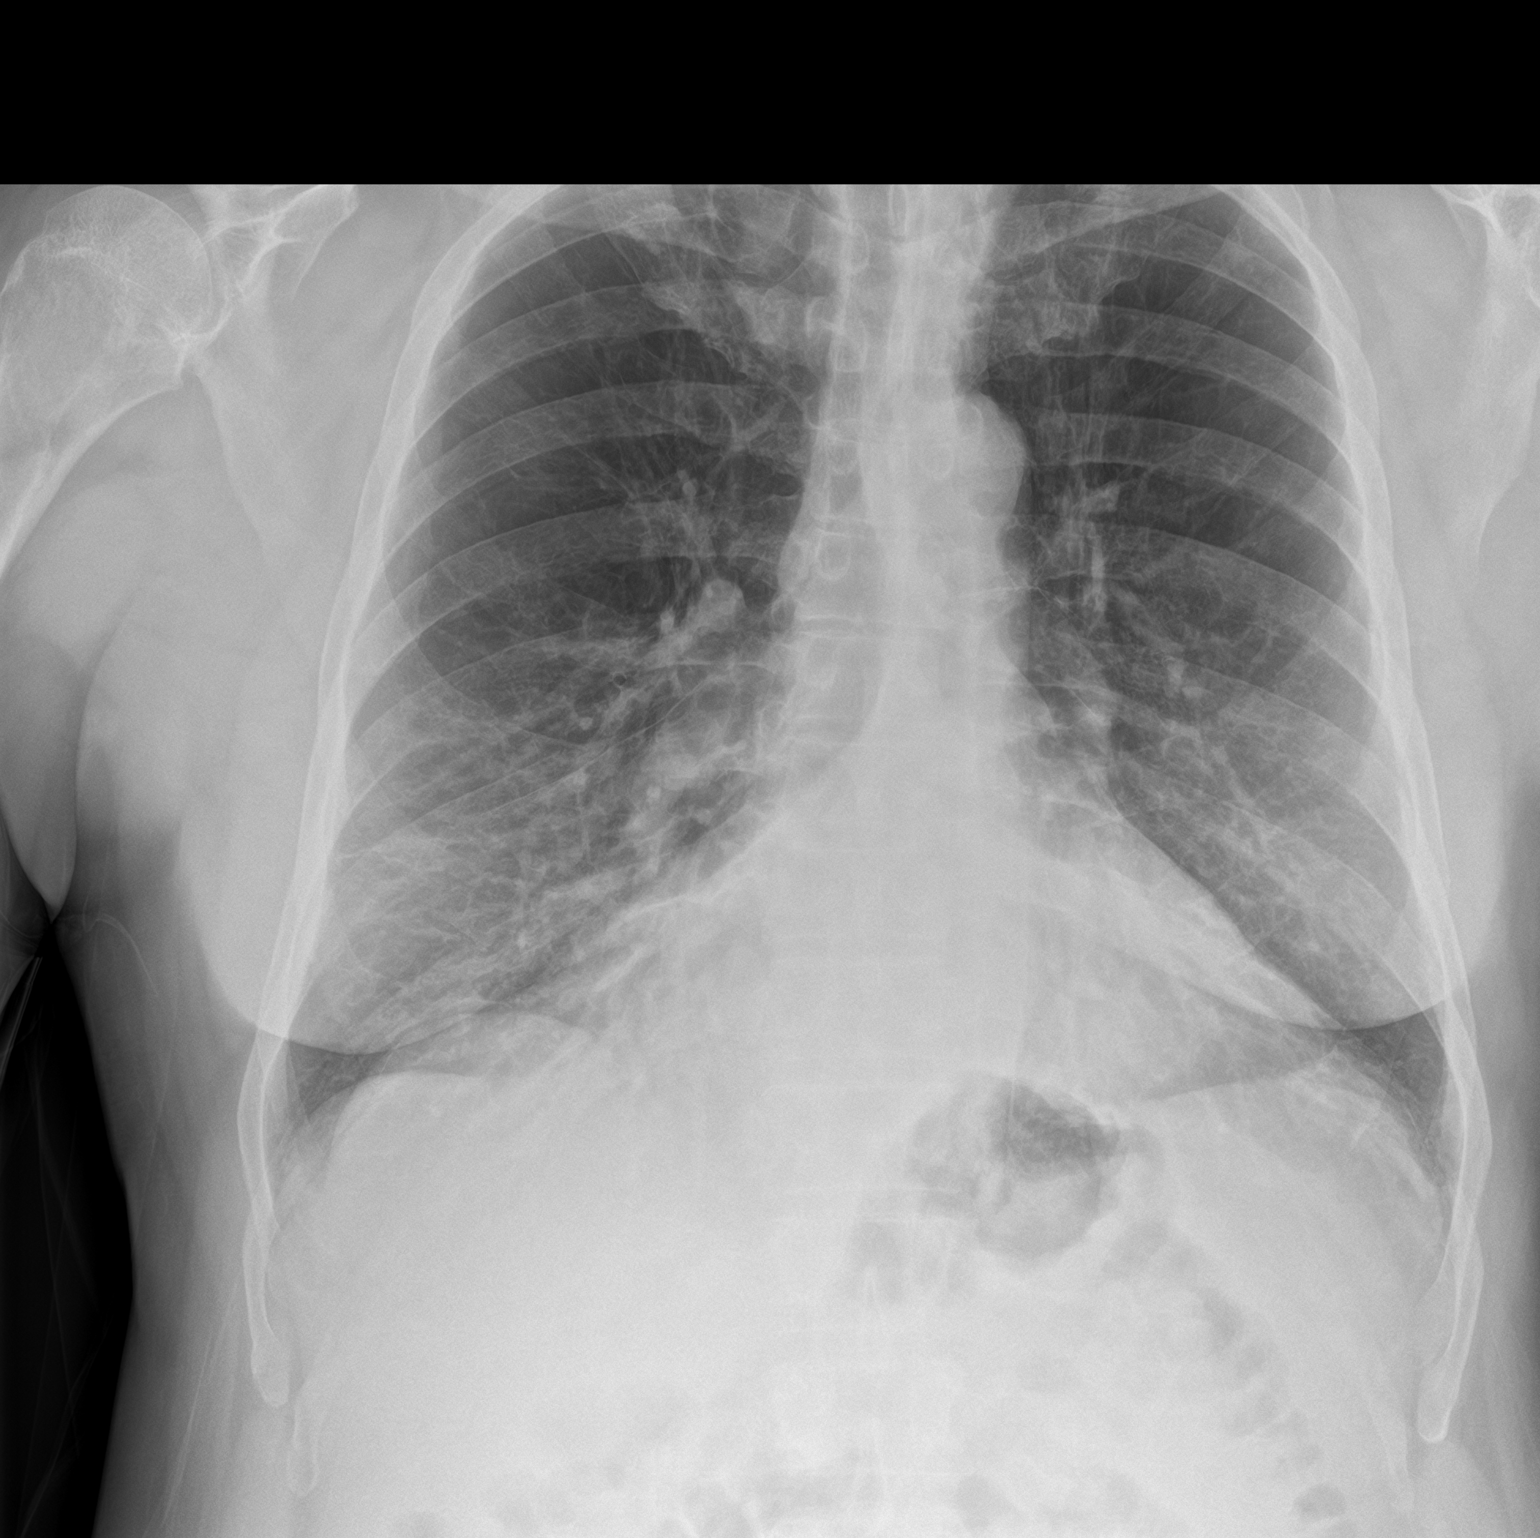

[chest lat]
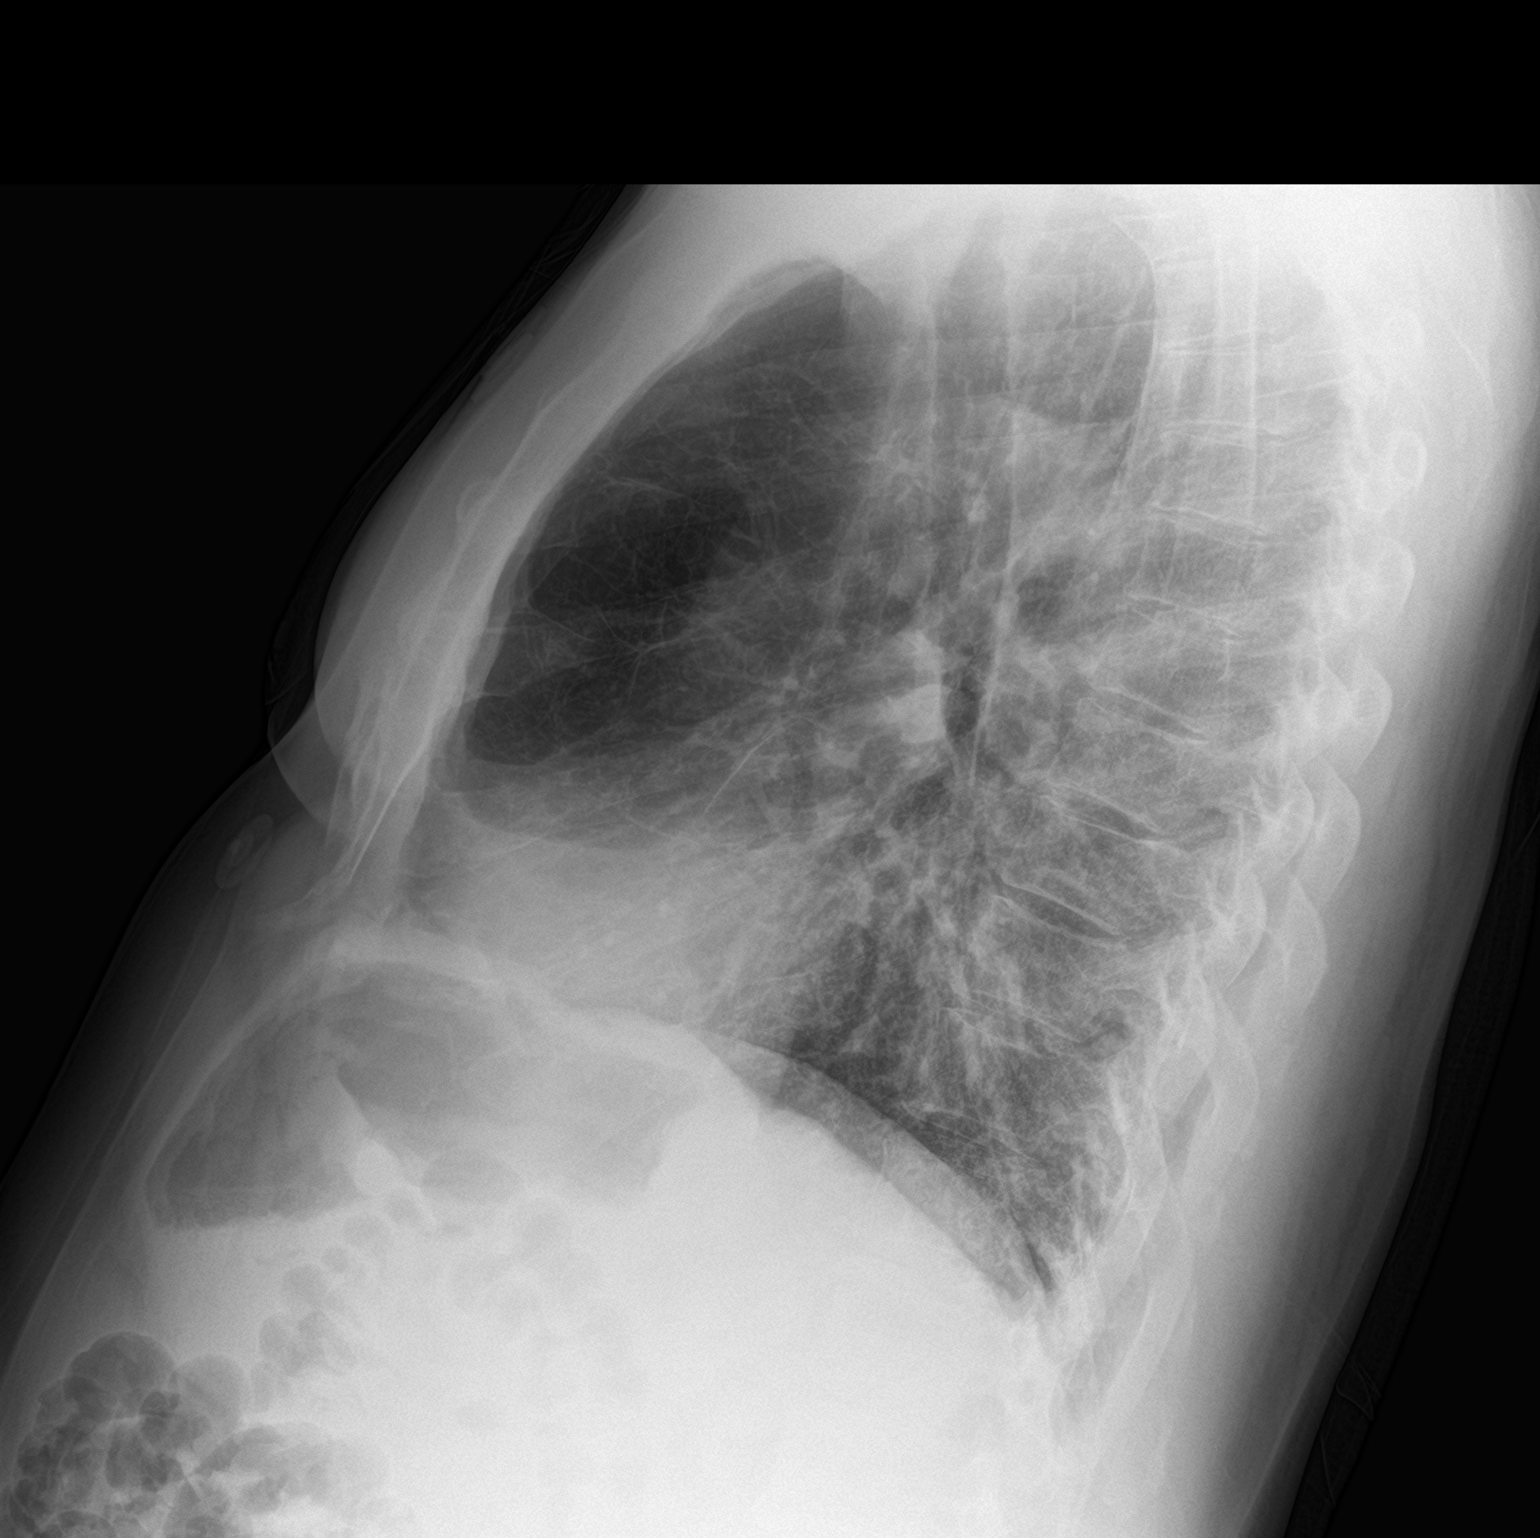

[2 of 2 positions shown; findings below may reference images not displayed]

FINDINGS: Probable emphysematous disease. Streaky lower lobe opacities. Normal
heart size. No pneumothorax.
IMPRESSION: Probable emphysematous disease. Streaky lower lobe opacities may
reflect chronic bronchitic change though no priors available for
comparison. Could also consider atypical or viral pneumonia.

## 2022-08-13 IMAGING — CT CT ABD-PEL WO/W CM
2 of 13 series · 10 of 46 positions shown, 16 images · IV contrast (APPLIED)
Comparison: 10/19/2015

CLINICAL DATA: Hematuria

EXAM:
CT ABDOMEN AND PELVIS WITHOUT AND WITH CONTRAST
TECHNIQUE: Multidetector CT imaging of the abdomen and pelvis was performed
following the standard protocol before and following the bolus
administration of intravenous contrast.
CONTRAST:  100mL OMNIPAQUE IOHEXOL 350 MG/ML SOLN

[Series 10: thins · axial · 0.95mm/px · z∈[+1009,+1386]mm · 8 of 694 slices shown, 13 images]
[im 78/694  soft-tissue]
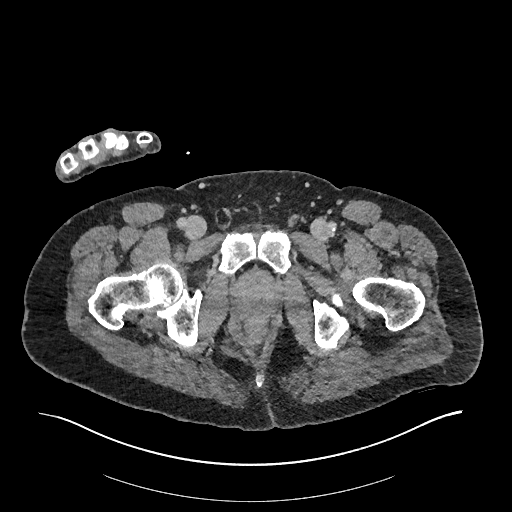
[im 78/694  bone]
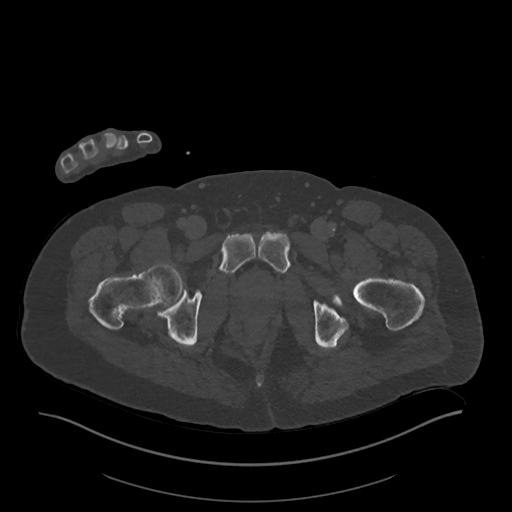
[im 155/694  soft-tissue]
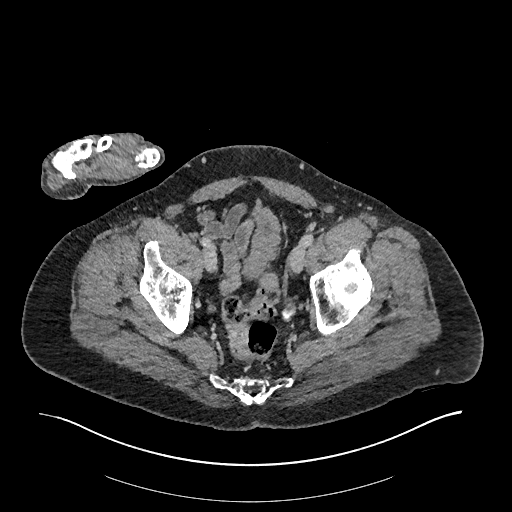
[im 232/694  soft-tissue]
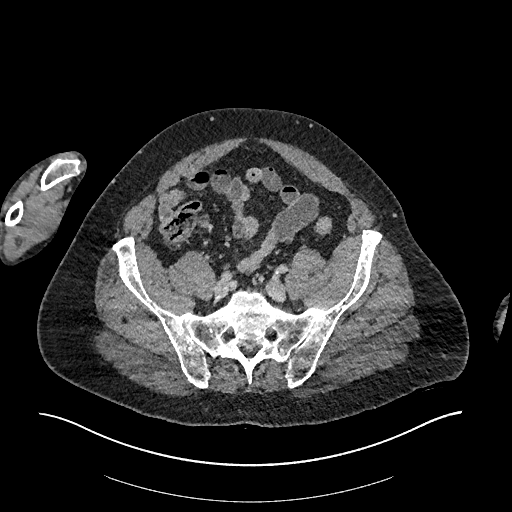
[im 309/694  soft-tissue]
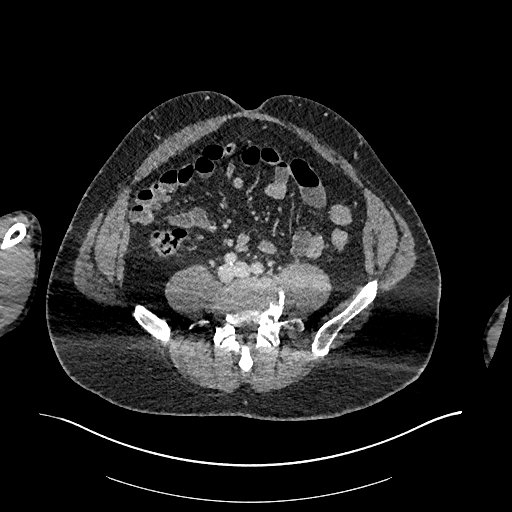
[im 386/694  soft-tissue]
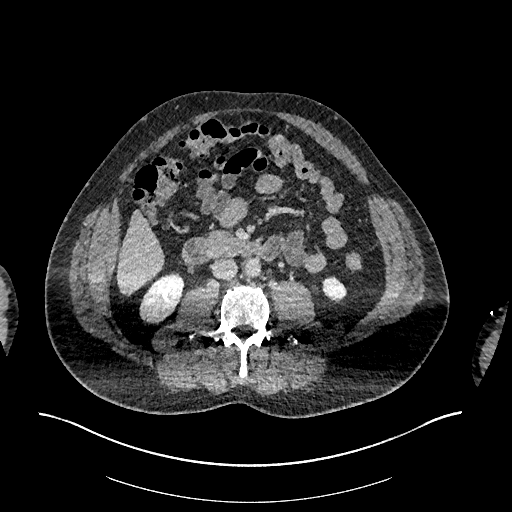
[im 386/694  lung]
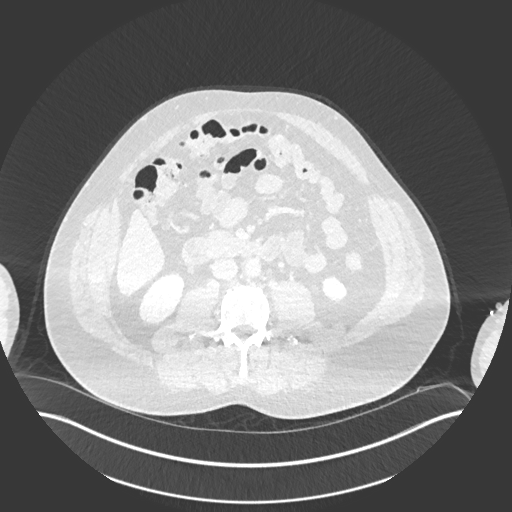
[im 463/694  soft-tissue]
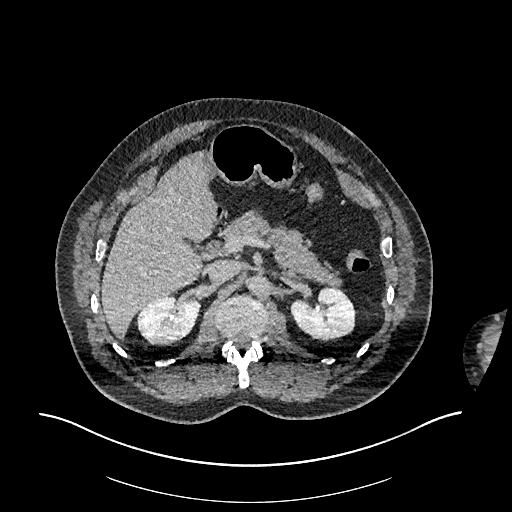
[im 463/694  lung]
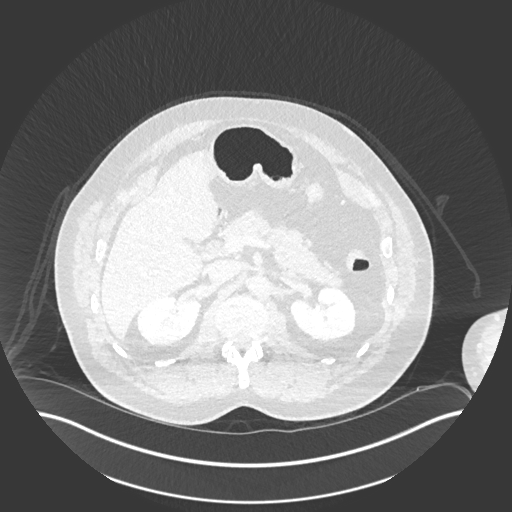
[im 540/694  soft-tissue]
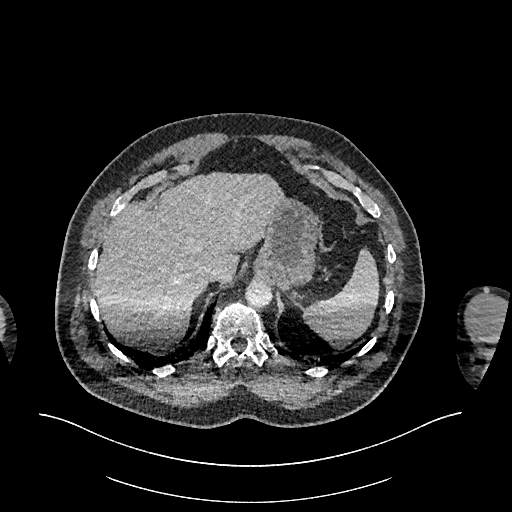
[im 540/694  lung]
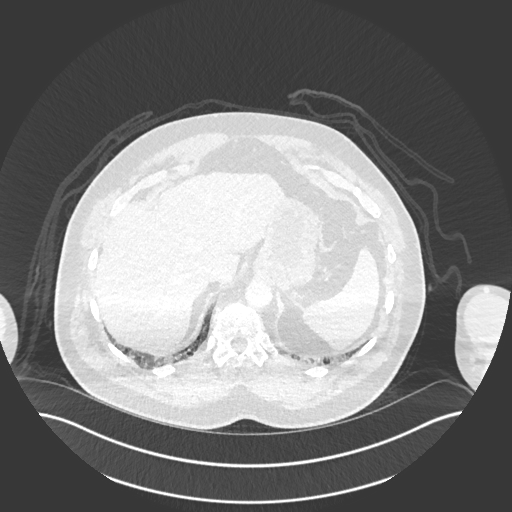
[im 617/694  soft-tissue]
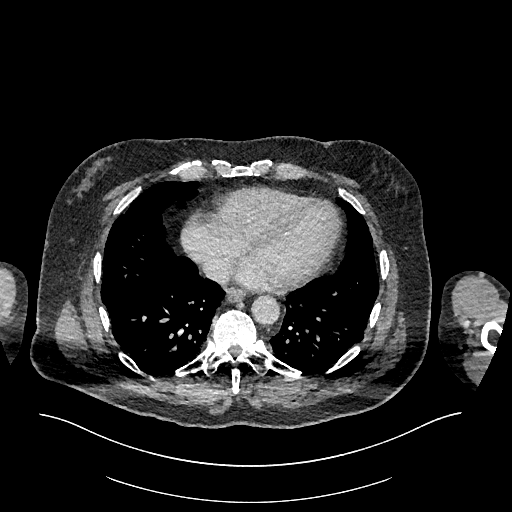
[im 617/694  lung]
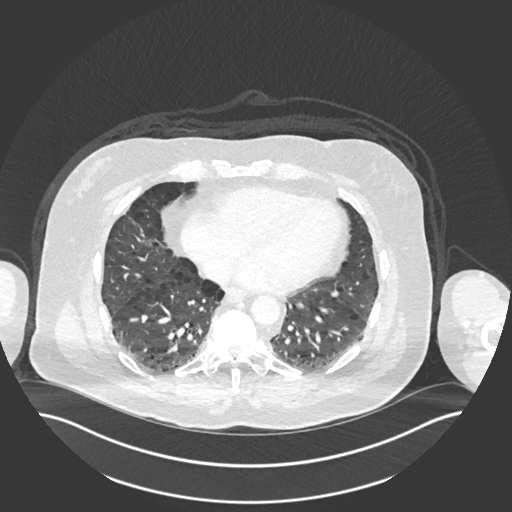

[Series 11: coronal · coronal · 0.95mm/px · 2 of 101 slices shown, 3 images]
[im 34/101  soft-tissue]
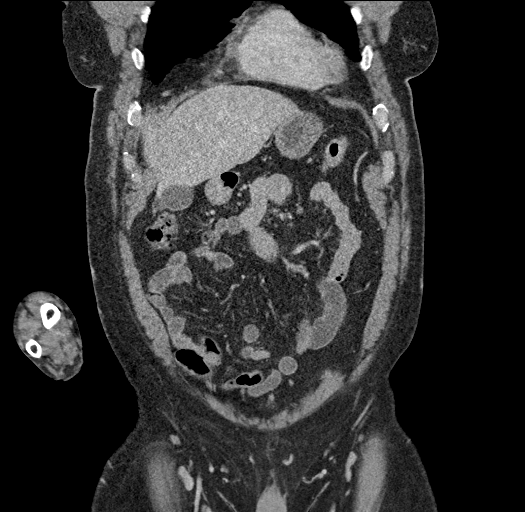
[im 34/101  bone]
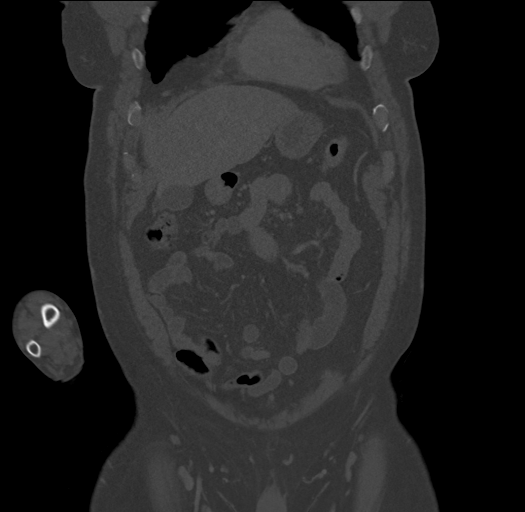
[im 67/101  soft-tissue]
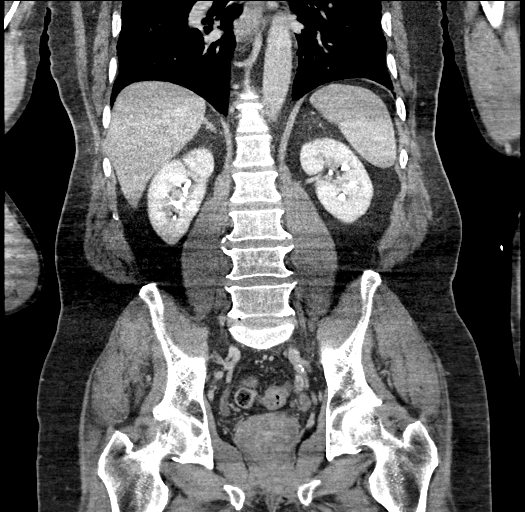

[10 of 46 positions shown; findings below may reference images not displayed]

FINDINGS: Lower chest: No acute abnormality. Severe emphysema in the included
bilateral lung bases.

Hepatobiliary: No solid liver abnormality is seen. No gallstones,
gallbladder wall thickening, or biliary dilatation.

Pancreas: Unremarkable. No pancreatic ductal dilatation or
surrounding inflammatory changes.

Spleen: Normal in size without significant abnormality.

Adrenals/Urinary Tract: Adrenal glands are unremarkable. Kidneys are
normal, without renal calculi, solid lesion, or hydronephrosis. The
distal left ureter is patulous, with a somewhat nodular and web-like
filling defect in the distal third of the ureter, difficult to
accurately measure although approximately 5-6 mm in size and is
located approximately 5 cm proximal to the ureterovesicular junction
(series 14, image 425). Thickening of the urinary bladder.

Stomach/Bowel: Stomach is within normal limits. Appendix appears
normal. No evidence of bowel wall thickening, distention, or
inflammatory changes. Occasional sigmoid diverticula.

Vascular/Lymphatic: Aortic atherosclerosis. No enlarged abdominal or
pelvic lymph nodes.

Reproductive: Prostatomegaly with median lobe hypertrophy.

Other: No abdominal wall hernia or abnormality. No abdominopelvic
ascites.

Musculoskeletal: No acute or significant osseous findings.
IMPRESSION: 1. The distal left ureter is patulous, with a somewhat nodular and
web-like filling defect in the distal third of the ureter, difficult
to accurately measure although approximately 5-6 mm in size and is
located approximately 5 cm proximal to the ureterovesicular
junction. This may reflect clot within the ureter although is
somewhat concerning for urothelial malignancy.
2. No evidence of calculus, mass, or other filling defect.
3. Prostatomegaly with thickening of the urinary bladder, likely
related to chronic outlet obstruction.
4. Aortic Atherosclerosis (8CPLS-LGI.I).
5.  Emphysema (8CPLS-4D9.I).

## 2022-09-19 NOTE — Progress Notes (Addendum)
GI Office Note    Referring Provider: Arlina Robes, * Primary Care Physician:  Arlina Robes, MD Primary Gastroenterologist: Dolores Frame, MD  Date:  09/20/2022  ID:  Zachary Morrison, DOB [birth year], MRN [id number]   Chief Complaint   Chief Complaint  Patient presents with   Blood In Stools    Patient here today due to seeing both bright red blood in stools and seeing dark stools at times. He says the last time he seen blood was around two weeks ago. Patient has and some shortness of breath, fatigue, chest pain. Patient denies any dizziness. Last hgb on August 10, 2022 was 14.3.   History of Present Illness  Zachary Morrison is a 72 y.o. male with a history of anxiety, arthritis, and chronic abdominal pain presenting today with complaint of rectal bleeding.  CT abdomen and pelvis with IV contrast 02/14/2020 - IMPRESSION: 1. The distal left ureter is patulous, with a somewhat nodular and web-like filling defect in the distal third of the ureter, difficult to accurately measure although approximately 5-6 mm in size and is located approximately 5 cm proximal to the ureterovesicular junction. This may reflect clot within the ureter although issomewhat concerning for urothelial malignancy. 2. No evidence of calculus, mass, or other filling defect.   Last office visit 06/18/2020.  Reported intermittent episodes of right flank pain and right upper quadrant pain described as sharp and episodes of burning sensation occasionally.  Unable to identify a specific time or food triggers.  At times episodes may wake him up at night but this is very occasional.  Also with some nausea or vomiting that occurs occasionally.  Sometimes with bloating to the right side of his abdomen.  Seen some fresh blood in the stool but this happened many years prior as well.  Advised to schedule EGD and colonoscopy and start Bentyl for abdominal pain.  Colonoscopy 07/01/2020: -12 mm polyp in the sigmoid  colon -25 mm polyp in the distal rectum removed piecemeal -Nonbleeding internal hemorrhoids -Perform flex sig in 6 months for surveillance and if normal repeat colonoscopy in 3 years  EGD 07/01/2020: -Normal esophagus -1 cm hiatal hernia -Normal stomach s/p biopsy -Erythematous duodenopathy s/p biopsy -Advised to take Bentyl as needed for abdominal pain  Labs 08/08/22: Hemoglobin 14.3, platelets 204, creatinine 1.14, glucose 125, normal LFTs.  Lipid panel with elevated cholesterol and triglycerides at 598, A1c 6.7.  Today: Rectal bleeding - Going on since February. No blood in the last couple of weeks. BRBRP is every now and then. Stools have been black since February.Every now and then he takes ibuprofen. Denies taking iron or pepto. He used to take oral B12. Denis any reflux or nausea or vomiting.   Pain in the right side of his abdomen - is not there all the time. Hard to say how often it occurs. Has not had any pain in a few days but a few weeks ago it was worse than what he remembers it being (not unbearable.  No specific triggers identified. Has had some shortness of breath for a while due to exposure to asbestos. Has had chest pain at times. Has seen cardiology years ago regarding chest pain at that time. Denies any dizziness. Denies dysphagia, reflux.   Has a good appetite. Has lost 4 pounds intentionally. Does not check blood sugars at home.   Just recently started the metformin.   Denies constipation or diarrhea. Sometimes goes twice daily. No straining.     Current Outpatient  Medications  Medication Sig Dispense Refill   acetaminophen (TYLENOL) 500 MG tablet Take 1,000 mg by mouth every 6 (six) hours as needed for moderate pain or headache.     atorvastatin (LIPITOR) 10 MG tablet Take 1 tablet by mouth daily.     Carboxymethylcellul-Glycerin (LUBRICATING EYE DROPS OP) Place 1 drop into both eyes daily as needed (dry eyes).     fluticasone furoate-vilanterol (BREO ELLIPTA)  100-25 MCG/INH AEPB Inhale 1 puff into the lungs daily.     metFORMIN (GLUCOPHAGE) 500 MG tablet Take 1 tablet by mouth 2 (two) times daily.     OVER THE COUNTER MEDICATION Apply 1 application topically daily as needed (stiffness). Triderma otc cream     sildenafil (VIAGRA) 100 MG tablet Take 1 tablet (100 mg total) by mouth daily as needed for erectile dysfunction. Take 1/2 to 1 tablet po prn (Patient taking differently: Take 50-100 mg by mouth daily as needed for erectile dysfunction.) 30 tablet prn   oxybutynin (DITROPAN) 5 MG tablet Take 1 tablet (5 mg total) by mouth every 8 (eight) hours as needed for up to 15 doses for bladder spasms. (Patient not taking: Reported on 06/23/2020) 30 tablet 1   No current facility-administered medications for this visit.    Past Medical History:  Diagnosis Date   Anxiety    Arthritis    Asbestos exposure    DOE (dyspnea on exertion)    history of asbestos exposure   Hematuria    History of hiatal hernia    History of kidney stones     Past Surgical History:  Procedure Laterality Date   BIOPSY  07/01/2020   Procedure: BIOPSY;  Surgeon: Marguerita Merles, Reuel Boom, MD;  Location: [facility];  Service: Gastroenterology;;  small bowel biopsy; gastric valve biopsy   COLONOSCOPY     COLONOSCOPY WITH PROPOFOL N/A 07/01/2020   Procedure: COLONOSCOPY WITH PROPOFOL;  Surgeon: Dolores Frame, MD;  Location: [facility];  Service: Gastroenterology;  Laterality: N/A;  AM   CYSTOSCOPY WITH RETROGRADE PYELOGRAM, URETEROSCOPY AND STENT PLACEMENT Left 03/12/2020   Procedure: CYSTOSCOPY WITH RETROGRADE PYELOGRAM, URETEROSCOPY, STONE EXTRACTION  AND DOUBLE J STENT PLACEMENT;  Surgeon: Marcine Matar, MD;  Location: [facility];  Service: Urology;  Laterality: Left;  45 MINS   ESOPHAGOGASTRODUODENOSCOPY (EGD) WITH PROPOFOL N/A 07/01/2020   Procedure: ESOPHAGOGASTRODUODENOSCOPY (EGD) WITH PROPOFOL;  Surgeon: Dolores Frame, MD;  Location: [facility];  Service: Gastroenterology;  Laterality: N/A;   KNEE ARTHROSCOPY Left    LITHOTRIPSY     three times   POLYPECTOMY  07/01/2020   Procedure: POLYPECTOMY INTESTINAL;  Surgeon: Dolores Frame, MD;  Location: [facility];  Service: Gastroenterology;;  sigmoid colon polyp;     Family History  Problem Relation Age of Onset   Diabetes Father    Heart disease Father    Healthy Sister    Healthy Brother    Cancer Sister    Healthy Brother    Hypertension Brother     Allergies as of 09/20/2022   (No Known Allergies)    Social History   Socioeconomic History   Marital status: Married    Spouse name: Not on file   Number of children: Not on file   Years of education: Not on file   Highest education level: Not on file  Occupational History   Occupation: retired    Associate Professor: [employer]  Tobacco Use   Smoking status: Never   Smokeless tobacco: Never  Vaping Use  Vaping Use: Never used  Substance and Sexual Activity   Alcohol use: Not Currently   Drug use: Never   Sexual activity: Not on file  Other Topics Concern   Not on file  Social History Narrative   Not on file   Social Determinants of Health   Financial Resource Strain: Not on file  Food Insecurity: Not on file  Transportation Needs: Not on file  Physical Activity: Not on file  Stress: Not on file  Social Connections: Not on file     Review of Systems   Gen: Denies fever, chills, anorexia. Denies fatigue, weakness, weight loss.  CV: Denies chest pain, palpitations, syncope, peripheral edema, and claudication. Resp: Denies dyspnea at rest, cough, wheezing, coughing up blood, and pleurisy. GI: See HPI Derm: Denies rash, itching, dry skin Psych: Denies depression, anxiety, memory loss, confusion. No homicidal or suicidal ideation.  Heme: Denies bruising, bleeding, and enlarged lymph nodes.   Physical Exam   BP (!) 147/86 (BP Location: Left Arm, Patient Position: Sitting, Cuff Size: Large)    Pulse 79   Temp 97.8 F (36.6 C) (Temporal)   Ht 6' (1.829 m)   Wt 218 lb 4.8 oz (99 kg)   BMI 29.61 kg/m   General:   Alert and oriented. No distress noted. Pleasant and cooperative.  Head:  Normocephalic and atraumatic. Eyes:  Conjuctiva clear without scleral icterus. Mouth:  Oral mucosa pink and moist. Good dentition. No lesions. Lungs:  Clear to auscultation bilaterally. No wheezes, rales, or rhonchi. No distress.  Heart:  S1, S2 present without murmurs appreciated.  Abdomen:  +BS, soft, non-tender and non-distended. No rebound or guarding. No HSM or masses noted. Rectal: deferred, patient declined Msk:  Symmetrical without gross deformities. Normal posture. Extremities:  Without edema. Neurologic:  Alert and  oriented x4 Psych:  Alert and cooperative. Normal mood and affect.   Assessment  Zachary Morrison is a 72 y.o. male with a history of anxiety, arthritis, and chronic abdominal pain presenting today with complaint of rectal bleeding.  Rectal bleeding, melena: Intermittent rectal bleeding since February but none in the last few weeks. States he has seen bright red as well as black stools. Denies regular NSAID use. Has good appetite. Rectal exam deferred today. No recent iron therapy. Rectal exam declined by patient today. Patient would like to perform EGD first and then if no improvement with treatment of hemorrhoids then consider early interval colonoscopy. We discussed the ability to perform both procedures same day however he would like to undergo EGD alone. Will start on PPI once daily empirically for potential upper GI bleed. Denies N/V, dysphagia. Will check CBC and iron panel given melena and rectal bleeding.   Abdominal pain: History of chronic intermittent RUQ pain. Pain is usually relieved with dicyclomine as needed and is in need of refill.   PLAN   Proceed with upper endoscopy with propofol by Dr. Levon Hedger in near future: the risks, benefits, and alternatives have  been discussed with the patient in detail. The patient states understanding and desires to proceed. ASA 2 Hold metformin the night prior to morning of procedure Start pantoprazole 40 mg once daily.  Will treat with hemorrhoid cream BID for 1 week today and if no improvement will consider colonoscopy Dicyclomine 10 mg every 12 hours as needed.  CBC and iron panel.  Follow-up in 12-16 weeks (8 weeks after procedure).     Brooke Bonito, MSN, FNP-BC, AGACNP-BC [facility]  I have reviewed the note  and agree with the APP's assessment as described in this progress note  We will schedule him for a flexible sigmoidoscopy on the same day of esophagogastroduodenospy as he is due for 11-month surveillance after his last colonoscopy given the presence of large polyp requiring piecemeal polypectomy.  Katrinka Blazing, MD Gastroenterology and Hepatology [facility]

## 2022-09-20 ENCOUNTER — Encounter: Payer: Self-pay | Admitting: Gastroenterology

## 2022-09-20 ENCOUNTER — Ambulatory Visit (INDEPENDENT_AMBULATORY_CARE_PROVIDER_SITE_OTHER): Payer: Medicare HMO | Admitting: Gastroenterology

## 2022-09-20 ENCOUNTER — Telehealth: Payer: Self-pay | Admitting: *Deleted

## 2022-09-20 VITALS — BP 147/86 | HR 79 | Temp 97.8°F | Ht 72.0 in | Wt 218.3 lb

## 2022-09-20 DIAGNOSIS — K625 Hemorrhage of anus and rectum: Secondary | ICD-10-CM

## 2022-09-20 DIAGNOSIS — R1011 Right upper quadrant pain: Secondary | ICD-10-CM | POA: Diagnosis not present

## 2022-09-20 DIAGNOSIS — G8929 Other chronic pain: Secondary | ICD-10-CM

## 2022-09-20 DIAGNOSIS — K921 Melena: Secondary | ICD-10-CM

## 2022-09-20 MED ORDER — DICYCLOMINE HCL 10 MG PO CAPS: 10.0000 mg | ORAL_CAPSULE | Freq: Two times a day (BID) | ORAL | 1 refills | Status: DC | PRN

## 2022-09-20 MED ORDER — PANTOPRAZOLE SODIUM 40 MG PO TBEC: 40.0000 mg | DELAYED_RELEASE_TABLET | Freq: Every day | ORAL | 2 refills | Status: DC

## 2022-09-20 NOTE — Patient Instructions (Addendum)
Start pantoprazole 40 mg once daily, 30 minutes prior to breakfast.  Dicyclomine 10 mg every 12 hours as needed for right-sided abdominal cramping.  We are scheduling you for an upper endoscopy in the near future with Dr. Levon Hedger.  I am providing you some lab slips today to have your hemoglobin and iron rechecked.  You will go just down the hall from our office to [facility].  Please pick up an over-the-counter antihemorrhoid cream such as Preparation H or a hydrocortisone base and apply this twice daily for 1 week and monitor for any recurrence of bright red blood from your rectum.  We will follow-up in about 8 weeks after your procedure.  It was a pleasure to see you today. I want to create trusting relationships with patients. If you receive a survey regarding your visit,  I greatly appreciate you taking time to fill this out on paper or through your MyChart. I value your feedback.  Brooke Bonito, MSN, FNP-BC, AGACNP-BC [facility]

## 2022-09-20 NOTE — Telephone Encounter (Signed)
Received approval for dicyclomine. Sent copy to scan center.

## 2022-09-21 ENCOUNTER — Encounter (INDEPENDENT_AMBULATORY_CARE_PROVIDER_SITE_OTHER): Payer: Self-pay

## 2022-10-12 ENCOUNTER — Encounter: Payer: Self-pay | Admitting: *Deleted

## 2022-10-12 ENCOUNTER — Telehealth: Payer: Self-pay | Admitting: Gastroenterology

## 2022-10-12 NOTE — Telephone Encounter (Signed)
Noted. Mailed him a letter.

## 2022-10-12 NOTE — Progress Notes (Signed)
Message sent to [facility] at Endo to see if we can add flex sig to EGD on 11/08/22 at 8:30am.

## 2022-10-12 NOTE — Telephone Encounter (Signed)
Attempted to call patient to discuss performing a flexible sigmoidoscopy at time of his upper endoscopy given large polyp removed in March 2022 in piecemeal fashion.  This is for surveillance of colon cancer and given his rectal bleeding it is recommended.  We discussed this at his office visit and he declined a full colonoscopy.   Will mail patient a letter with request to call the office to discuss flexible sigmoidoscopy given was unable to leave a message as his voicemail box was full.  Zachary Morrison -please draft a letter to the patient asking him to contact the office to discuss procedures.  Brooke Bonito, MSN, APRN, FNP-BC, AGACNP-BC [facility]

## 2022-10-14 NOTE — Progress Notes (Signed)
10/14/22-resent message to Endo

## 2022-10-17 NOTE — Progress Notes (Signed)
Flex sig added to booking per Eber Jones

## 2022-10-28 LAB — IRON,TIBC AND FERRITIN PANEL
Ferritin: 895 ng/mL — ABNORMAL HIGH (ref 30–400)
Iron Saturation: 34 % (ref 15–55)
Iron: 123 ug/dL (ref 38–169)
Total Iron Binding Capacity: 367 ug/dL (ref 250–450)
UIBC: 244 ug/dL (ref 111–343)

## 2022-10-28 LAB — CBC
Hematocrit: 42.2 % (ref 37.5–51.0)
Hemoglobin: 13.6 g/dL (ref 13.0–17.7)
MCH: 30.9 pg (ref 26.6–33.0)
MCHC: 32.2 g/dL (ref 31.5–35.7)
MCV: 96 fL (ref 79–97)
Platelets: 200 10*3/uL (ref 150–450)
RBC: 4.4 x10E6/uL (ref 4.14–5.80)
RDW: 12 % (ref 11.6–15.4)
WBC: 6 10*3/uL (ref 3.4–10.8)

## 2022-10-31 NOTE — Telephone Encounter (Signed)
Spoke to pt, he informed me that he does not want the EGD at this time.

## 2022-11-02 ENCOUNTER — Encounter (HOSPITAL_COMMUNITY): Payer: Self-pay

## 2022-11-02 ENCOUNTER — Encounter (HOSPITAL_COMMUNITY)
Admission: RE | Admit: 2022-11-02 | Discharge: 2022-11-02 | Disposition: A | Discharge status: Home / Self Care | Payer: Medicare HMO | Source: Ambulatory Visit | Attending: Gastroenterology | Admitting: Gastroenterology

## 2022-11-02 ENCOUNTER — Other Ambulatory Visit: Payer: Self-pay

## 2022-11-02 HISTORY — DX: Type 2 diabetes mellitus without complications: E11.9

## 2022-11-02 NOTE — Pre-Procedure Instructions (Signed)
Attempted pre-op phone call. MB is full and I could not leave a message.

## 2022-11-07 ENCOUNTER — Other Ambulatory Visit: Payer: Self-pay | Admitting: *Deleted

## 2022-11-07 ENCOUNTER — Telehealth (INDEPENDENT_AMBULATORY_CARE_PROVIDER_SITE_OTHER): Payer: Self-pay | Admitting: Gastroenterology

## 2022-11-07 DIAGNOSIS — D649 Anemia, unspecified: Secondary | ICD-10-CM

## 2022-11-07 DIAGNOSIS — R7989 Other specified abnormal findings of blood chemistry: Secondary | ICD-10-CM

## 2022-11-07 NOTE — Telephone Encounter (Signed)
Noted. Pt is scheduled for both procedures tomorrow

## 2022-11-07 NOTE — Telephone Encounter (Signed)
Pt called into office stating he did not have prep for colonoscopy. Pt is having EGD and Flex sig tomorrow. Went over flex sig instructions with pt.

## 2022-11-07 NOTE — Telephone Encounter (Signed)
Spoke to pt, he informed me that he is having procedure done tomorrow, both of them.

## 2022-11-07 NOTE — Telephone Encounter (Signed)
Called pt no answer. Tried to leave a message, mailbox is full

## 2022-11-08 ENCOUNTER — Ambulatory Visit (HOSPITAL_COMMUNITY): Payer: Medicare HMO | Admitting: Anesthesiology

## 2022-11-08 ENCOUNTER — Ambulatory Visit (HOSPITAL_COMMUNITY)
Admission: RE | Admit: 2022-11-08 | Discharge: 2022-11-08 | Disposition: A | Discharge status: Home / Self Care | Payer: Medicare HMO | Attending: Gastroenterology | Admitting: Gastroenterology

## 2022-11-08 ENCOUNTER — Encounter (HOSPITAL_COMMUNITY)
Admission: RE | Disposition: A | Discharge status: Home / Self Care | Payer: Self-pay | Source: Home / Self Care | Attending: Gastroenterology

## 2022-11-08 ENCOUNTER — Ambulatory Visit (HOSPITAL_BASED_OUTPATIENT_CLINIC_OR_DEPARTMENT_OTHER): Payer: Medicare HMO | Admitting: Anesthesiology

## 2022-11-08 DIAGNOSIS — K3189 Other diseases of stomach and duodenum: Secondary | ICD-10-CM | POA: Insufficient documentation

## 2022-11-08 DIAGNOSIS — D128 Benign neoplasm of rectum: Secondary | ICD-10-CM | POA: Diagnosis not present

## 2022-11-08 DIAGNOSIS — Z7984 Long term (current) use of oral hypoglycemic drugs: Secondary | ICD-10-CM | POA: Insufficient documentation

## 2022-11-08 DIAGNOSIS — K921 Melena: Secondary | ICD-10-CM

## 2022-11-08 DIAGNOSIS — Z09 Encounter for follow-up examination after completed treatment for conditions other than malignant neoplasm: Secondary | ICD-10-CM | POA: Diagnosis not present

## 2022-11-08 DIAGNOSIS — Z860100 Personal history of colon polyps, unspecified: Secondary | ICD-10-CM

## 2022-11-08 DIAGNOSIS — F419 Anxiety disorder, unspecified: Secondary | ICD-10-CM | POA: Diagnosis not present

## 2022-11-08 DIAGNOSIS — E119 Type 2 diabetes mellitus without complications: Secondary | ICD-10-CM | POA: Insufficient documentation

## 2022-11-08 DIAGNOSIS — D126 Benign neoplasm of colon, unspecified: Secondary | ICD-10-CM

## 2022-11-08 DIAGNOSIS — K625 Hemorrhage of anus and rectum: Secondary | ICD-10-CM

## 2022-11-08 DIAGNOSIS — M1990 Unspecified osteoarthritis, unspecified site: Secondary | ICD-10-CM | POA: Diagnosis not present

## 2022-11-08 DIAGNOSIS — K317 Polyp of stomach and duodenum: Secondary | ICD-10-CM | POA: Diagnosis not present

## 2022-11-08 DIAGNOSIS — Z79899 Other long term (current) drug therapy: Secondary | ICD-10-CM | POA: Diagnosis not present

## 2022-11-08 DIAGNOSIS — Z791 Long term (current) use of non-steroidal anti-inflammatories (NSAID): Secondary | ICD-10-CM | POA: Diagnosis not present

## 2022-11-08 HISTORY — PX: POLYPECTOMY: SHX5525

## 2022-11-08 HISTORY — PX: FLEXIBLE SIGMOIDOSCOPY: SHX5431

## 2022-11-08 HISTORY — PX: BIOPSY: SHX5522

## 2022-11-08 HISTORY — PX: ESOPHAGOGASTRODUODENOSCOPY (EGD) WITH PROPOFOL: SHX5813

## 2022-11-08 LAB — GLUCOSE, CAPILLARY: Glucose-Capillary: 134 mg/dL — ABNORMAL HIGH (ref 70–99)

## 2022-11-08 SURGERY — ESOPHAGOGASTRODUODENOSCOPY (EGD) WITH PROPOFOL
Anesthesia: General

## 2022-11-08 MED ORDER — LIDOCAINE HCL (PF) 2 % IJ SOLN
INTRAMUSCULAR | Status: AC
  Filled 2022-11-08: qty 10

## 2022-11-08 MED ORDER — LIDOCAINE HCL (CARDIAC) PF 100 MG/5ML IV SOSY
PREFILLED_SYRINGE | INTRAVENOUS | Status: DC | PRN
  Administered 2022-11-08: 100 mg via INTRAVENOUS

## 2022-11-08 MED ORDER — GLYCOPYRROLATE PF 0.2 MG/ML IJ SOSY
PREFILLED_SYRINGE | INTRAMUSCULAR | Status: DC | PRN
  Administered 2022-11-08: .1 mg via INTRAVENOUS

## 2022-11-08 MED ORDER — PROPOFOL 500 MG/50ML IV EMUL
INTRAVENOUS | Status: DC | PRN
  Administered 2022-11-08: 125 ug/kg/min via INTRAVENOUS

## 2022-11-08 MED ORDER — GLYCOPYRROLATE PF 0.2 MG/ML IJ SOSY
PREFILLED_SYRINGE | INTRAMUSCULAR | Status: AC
  Filled 2022-11-08: qty 1

## 2022-11-08 MED ORDER — LACTATED RINGERS IV SOLN: INTRAVENOUS | Status: DC

## 2022-11-08 MED ORDER — PROPOFOL 10 MG/ML IV BOLUS
INTRAVENOUS | Status: DC | PRN
  Administered 2022-11-08: 50 mg via INTRAVENOUS
  Administered 2022-11-08: 100 mg via INTRAVENOUS
  Administered 2022-11-08: 30 mg via INTRAVENOUS

## 2022-11-08 MED ORDER — PANTOPRAZOLE SODIUM 40 MG PO TBEC: 40.0000 mg | DELAYED_RELEASE_TABLET | Freq: Every day | ORAL | 2 refills | Status: AC

## 2022-11-08 NOTE — Anesthesia Postprocedure Evaluation (Signed)
Anesthesia Post Note  Patient: Zachary Morrison  Procedure(s) Performed: ESOPHAGOGASTRODUODENOSCOPY (EGD) WITH PROPOFOL FLEXIBLE SIGMOIDOSCOPY BIOPSY POLYPECTOMY  Patient location during evaluation: Phase II Anesthesia Type: General Level of consciousness: awake and alert and oriented Pain management: pain level controlled Vital Signs Assessment: post-procedure vital signs reviewed and stable Respiratory status: spontaneous breathing, nonlabored ventilation and respiratory function stable Cardiovascular status: blood pressure returned to baseline and stable Postop Assessment: no apparent nausea or vomiting Anesthetic complications: no  No notable events documented.   Last Vitals:  Vitals:   11/08/22 0656 11/08/22 0852  BP: (!) 162/85 (!) 143/70  Pulse: 90 99  Resp: 18 (!) 23  Temp: 36.7 C 36.4 C  SpO2: 95% 98%    Last Pain:  Vitals:   11/08/22 0852  TempSrc: Oral  PainSc: 0-No pain                 Matej Sappenfield C Ashtin Melichar

## 2022-11-08 NOTE — Anesthesia Preprocedure Evaluation (Signed)
Anesthesia Evaluation  Patient identified by MRN, date of birth, ID band Patient awake    Reviewed: Allergy & Precautions, H&P , NPO status , Patient's Chart, lab work & pertinent test results  Airway Mallampati: II  TM Distance: >3 FB Neck ROM: Full    Dental  (+) Edentulous Upper, Edentulous Lower   Pulmonary neg pulmonary ROS   Pulmonary exam normal breath sounds clear to auscultation       Cardiovascular + DOE  Normal cardiovascular exam Rhythm:Regular Rate:Normal     Neuro/Psych  PSYCHIATRIC DISORDERS Anxiety     negative neurological ROS     GI/Hepatic Neg liver ROS, hiatal hernia,GERD  Medicated and Controlled,,  Endo/Other  diabetes, Well Controlled, Type 2, Oral Hypoglycemic Agents    Renal/GU negative Renal ROS  negative genitourinary   Musculoskeletal  (+) Arthritis , Osteoarthritis,    Abdominal   Peds negative pediatric ROS (+)  Hematology negative hematology ROS (+)   Anesthesia Other Findings   Reproductive/Obstetrics negative OB ROS                              Anesthesia Physical Anesthesia Plan  ASA: 2  Anesthesia Plan: General   Post-op Pain Management: Minimal or no pain anticipated   Induction: Intravenous  PONV Risk Score and Plan: 1 and Propofol infusion  Airway Management Planned: Nasal Cannula and Natural Airway  Additional Equipment:   Intra-op Plan:   Post-operative Plan:   Informed Consent: I have reviewed the patients History and Physical, chart, labs and discussed the procedure including the risks, benefits and alternatives for the proposed anesthesia with the patient or authorized representative who has indicated his/her understanding and acceptance.     Dental advisory given  Plan Discussed with: CRNA and Surgeon  Anesthesia Plan Comments:          Anesthesia Quick Evaluation

## 2022-11-08 NOTE — Op Note (Signed)
[facility] Patient Name: Zachary Morrison Procedure Date: 11/08/2022 8:05 AM MRN: [id number] Date of Birth: 03/01/1951 Attending MD: Katrinka Blazing , , [id number] CSN: [id number] Age: 72 Admit Type: Outpatient Procedure:                Upper GI endoscopy Indications:              Melena Providers:                Katrinka Blazing, Nena Polio, RN, Lennice Sites                            Technician, Technician Referring MD:              Medicines:                Monitored Anesthesia Care Complications:            No immediate complications. Estimated Blood Loss:     Estimated blood loss: none. Procedure:                Pre-Anesthesia Assessment:                           - Prior to the procedure, a History and Physical                            was performed, and patient medications, allergies                            and sensitivities were reviewed. The patient's                            tolerance of previous anesthesia was reviewed.                           - The risks and benefits of the procedure and the                            sedation options and risks were discussed with the                            patient. All questions were answered and informed                            consent was obtained.                           - ASA Grade Assessment: III - A patient with severe                            systemic disease.                           After obtaining informed consent, the endoscope was                            passed under direct vision. Throughout the  procedure, the patient's blood pressure, pulse, and                            oxygen saturations were monitored continuously. The                            GIF-H190 ([id number]) scope was introduced through the                            mouth, and advanced to the second part of duodenum.                            The upper GI endoscopy was accomplished without                             difficulty. The patient tolerated the procedure                            well. Scope In: 8:21:39 AM Scope Out: 8:33:46 AM Total Procedure Duration: 0 hours 12 minutes 7 seconds  Findings:      The esophagus was normal.      The gastroesophageal flap valve was visualized endoscopically and       classified as Hill Grade II (fold present, opens with respiration).      A single 2 mm sessile polyp was found in the gastric fundus. The polyp       was removed with a cold biopsy forceps. Resection and retrieval were       complete.      A few localized diminutive erosions with no stigmata of recent bleeding       were found in the gastric antrum. Biopsies were taken with a cold       forceps for Helicobacter pylori testing.      Patchy mildly erythematous mucosa without active bleeding was found in       the duodenal bulb.      A single 6 mm submucosal nodule was found in the second portion of the       duodenum. The nodule was Paris classification Is (protruding, sessile).       Biopsies were taken with a cold forceps for histology. Impression:               - Normal esophagus.                           - A single gastric polyp. Resected and retrieved.                           - Erosive gastropathy with no stigmata of recent                            bleeding. Biopsied.                           - Erythematous duodenopathy.                           - Submucosal nodule found in the  duodenum. Biopsied. Moderate Sedation:      Per Anesthesia Care Recommendation:           - Discharge patient to home (ambulatory).                           - Resume previous diet.                           - Await pathology results.                           - Use Protonix (pantoprazole) 40 mg PO daily.                           - No aspirin, ibuprofen, naproxen, or other                            non-steroidal anti-inflammatory drugs. Procedure Code(s):        --- Professional ---                            [phone], Esophagogastroduodenoscopy, flexible,                            transoral; with biopsy, single or multiple Diagnosis Code(s):        --- Professional ---                           K31.7, Polyp of stomach and duodenum                           K31.89, Other diseases of stomach and duodenum                           K92.1, Melena (includes Hematochezia) CPT copyright 2022 American [employer] Association. All rights reserved. The codes documented in this report are preliminary and upon coder review may  be revised to meet current compliance requirements. Katrinka Blazing, MD Katrinka Blazing,  11/08/2022 8:39:23 AM This report has been signed electronically. Number of Addenda: 0

## 2022-11-08 NOTE — H&P (Signed)
Zachary Morrison is an 72 y.o. male.   Chief Complaint: melena and history of piecemeal polypectomy. HPI: Zachary Morrison is a 72 y.o. male with past medical history of anxiety, arthritis, who presents for evaluation of melena and for history of piecemeal polypectomy.  States having melena on a daily basis. The patient denies having any nausea, vomiting, fever, chills, hematochezia,  hematemesis, abdominal distention, abdominal pain, diarrhea, jaundice, pruritus or weight loss.  Takes ibuprofen seldom.  Past Medical History:  Diagnosis Date   Anxiety    Arthritis    Asbestos exposure    Diabetes mellitus without complication (HCC)    DOE (dyspnea on exertion)    history of asbestos exposure   Hematuria    History of hiatal hernia    History of kidney stones     Past Surgical History:  Procedure Laterality Date   BIOPSY  07/01/2020   Procedure: BIOPSY;  Surgeon: Marguerita Merles, Reuel Boom, MD;  Location: [facility];  Service: Gastroenterology;;  small bowel biopsy; gastric valve biopsy   COLONOSCOPY     COLONOSCOPY WITH PROPOFOL N/A 07/01/2020   Procedure: COLONOSCOPY WITH PROPOFOL;  Surgeon: Dolores Frame, MD;  Location: [facility];  Service: Gastroenterology;  Laterality: N/A;  AM   CYSTOSCOPY WITH RETROGRADE PYELOGRAM, URETEROSCOPY AND STENT PLACEMENT Left 03/12/2020   Procedure: CYSTOSCOPY WITH RETROGRADE PYELOGRAM, URETEROSCOPY, STONE EXTRACTION  AND DOUBLE J STENT PLACEMENT;  Surgeon: Marcine Matar, MD;  Location: [facility];  Service: Urology;  Laterality: Left;  45 MINS   ESOPHAGOGASTRODUODENOSCOPY (EGD) WITH PROPOFOL N/A 07/01/2020   Procedure: ESOPHAGOGASTRODUODENOSCOPY (EGD) WITH PROPOFOL;  Surgeon: Dolores Frame, MD;  Location: [facility];  Service: Gastroenterology;  Laterality: N/A;   KNEE ARTHROSCOPY Left    LITHOTRIPSY     three times   POLYPECTOMY  07/01/2020   Procedure: POLYPECTOMY INTESTINAL;  Surgeon: Dolores Frame, MD;  Location:  [facility];  Service: Gastroenterology;;  sigmoid colon polyp;     Family History  Problem Relation Age of Onset   Diabetes Father    Heart disease Father    Healthy Sister    Healthy Brother    Cancer Sister    Healthy Brother    Hypertension Brother    Social History:  reports that he has never smoked. He has never used smokeless tobacco. He reports that he does not currently use alcohol. He reports that he does not use drugs.  Allergies: No Known Allergies  Medications Prior to Admission  Medication Sig Dispense Refill   acetaminophen (TYLENOL) 500 MG tablet Take 1,000 mg by mouth every 6 (six) hours as needed for moderate pain or headache.     atorvastatin (LIPITOR) 10 MG tablet Take 10 mg by mouth daily.     Carboxymethylcellul-Glycerin (LUBRICATING EYE DROPS OP) Place 1 drop into both eyes daily as needed (something in eyes).     dicyclomine (BENTYL) 10 MG capsule Take 1 capsule (10 mg total) by mouth every 12 (twelve) hours as needed for spasms. 60 capsule 1   fluticasone furoate-vilanterol (BREO ELLIPTA) 100-25 MCG/INH AEPB Inhale 1 puff into the lungs daily.     ibuprofen (ADVIL) 600 MG tablet Take 600 mg by mouth every 6 (six) hours as needed for mild pain or moderate pain.     metFORMIN (GLUCOPHAGE) 500 MG tablet Take 500 mg by mouth 2 (two) times daily.     Omega-3 Fatty Acids (FISH OIL) 600 MG CAPS Take 600 mg by mouth daily.  OVER THE COUNTER MEDICATION Apply 1 application topically daily as needed (stiffness). Triderma otc cream     pantoprazole (PROTONIX) 40 MG tablet Take 1 tablet (40 mg total) by mouth daily. (Patient not taking: Reported on 11/02/2022) 30 tablet 2    Results for orders placed or performed during the hospital encounter of 11/08/22 (from the past 48 hour(s))  Glucose, capillary     Status: Abnormal   Collection Time: 11/08/22  6:41 AM  Result Value Ref Range   Glucose-Capillary 134 (H) 70 - 99 mg/dL    Comment: Glucose reference range applies  only to samples taken after fasting for at least 8 hours.   No results found.  Review of Systems  Gastrointestinal:  Positive for blood in stool.  All other systems reviewed and are negative.   Blood pressure (!) 162/85, pulse 90, temperature 98.1 F (36.7 C), temperature source Oral, resp. rate 18, height 6' (1.829 m), weight 94.8 kg, SpO2 95 %. Physical Exam  GENERAL: The patient is AO x3, in no acute distress. HEENT: Head is normocephalic and atraumatic. EOMI are intact. Mouth is well hydrated and without lesions. NECK: Supple. No masses LUNGS: Clear to auscultation. No presence of rhonchi/wheezing/rales. Adequate chest expansion HEART: RRR, normal s1 and s2. ABDOMEN: Soft, nontender, no guarding, no peritoneal signs, and nondistended. BS +. No masses. EXTREMITIES: Without any cyanosis, clubbing, rash, lesions or edema. NEUROLOGIC: AOx3, no focal motor deficit. SKIN: no jaundice, no rashes\ Assessment/Plan Zachary Morrison is a 72 y.o. male with past medical history of anxiety, arthritis, who presents for evaluation of melena and for history of piecemeal polypectomy. Will proceed with EGD and flexible sigmoidoscopy.  Dolores Frame, MD 11/08/2022, 7:32 AM

## 2022-11-08 NOTE — Op Note (Signed)
[facility] Patient Name: Zachary Morrison Procedure Date: 11/08/2022 8:05 AM MRN: [id number] Date of Birth: [birth year] Attending MD: Katrinka Blazing , , [id number] CSN: [id number] Age: 72 Admit Type: Outpatient Procedure:                Flexible Sigmoidoscopy Indications:              Surveillance: Piecemeal removal of large sessile                            adenoma last colonoscopy (< 3 yrs) Providers:                Katrinka Blazing, Nena Polio, RN, Lennice Sites                            Technician, Technician Referring MD:              Medicines:                Monitored Anesthesia Care Complications:            No immediate complications. Estimated Blood Loss:     Estimated blood loss: none. Procedure:                Pre-Anesthesia Assessment:                           - Prior to the procedure, a History and Physical                            was performed, and patient medications, allergies                            and sensitivities were reviewed. The patient's                            tolerance of previous anesthesia was reviewed.                           - The risks and benefits of the procedure and the                            sedation options and risks were discussed with the                            patient. All questions were answered and informed                            consent was obtained.                           - ASA Grade Assessment: III - A patient with severe                            systemic disease.                           After obtaining informed consent, the scope was  passed under direct vision. The GIF-H190 ([id number])                            scope was introduced through the anus and advanced                            to the the sigmoid colon. The flexible                            sigmoidoscopy was accomplished without difficulty.                            The patient tolerated the procedure well. The                             quality of the bowel preparation was good. Scope In: 8:40:29 AM Scope Out: 8:46:32 AM Total Procedure Duration: 0 hours 6 minutes 3 seconds  Findings:      The perianal and digital rectal examinations were normal.      A 10 mm polyp was found in the rectum. The polyp was sessile. The polyp       was removed with a hot snare. Resection and retrieval were complete.      Normal retroflexion. Impression:               - One 10 mm polyp in the rectum, removed with a hot                            snare. Resected and retrieved. Moderate Sedation:      Per Anesthesia Care Recommendation:           - Discharge patient to home (ambulatory).                           - Resume previous diet.                           - Await pathology results.                           - Repeat colonoscopy in 3 years for surveillance. Procedure Code(s):        --- Professional ---                           [phone], Sigmoidoscopy, flexible; with removal of                            tumor(s), polyp(s), or other lesion(s) by snare                            technique Diagnosis Code(s):        --- Professional ---                           Z86.010, Personal history of colonic polyps  D12.8, Benign neoplasm of rectum CPT copyright 2022 American Medical Association. All rights reserved. The codes documented in this report are preliminary and upon coder review may  be revised to meet current compliance requirements. Katrinka Blazing, MD Katrinka Blazing,  11/08/2022 8:53:15 AM This report has been signed electronically. Number of Addenda: 0

## 2022-11-08 NOTE — Discharge Instructions (Addendum)
You are being discharged to home.  Resume your previous diet.  We are waiting for your pathology results.  Take Protonix (pantoprazole) 40 mg by mouth once a day.  Do not take any aspirin, ibuprofen (including Advil, Motrin or Nuprin), naproxen (including Aleve), or any other non-steroidal anti-inflammatory drugs.  Your physician has recommended a repeat colonoscopy in three years for surveillance.

## 2022-11-08 NOTE — Transfer of Care (Addendum)
Immediate Anesthesia Transfer of Care Note  Patient: Zachary Morrison  Procedure(s) Performed: ESOPHAGOGASTRODUODENOSCOPY (EGD) WITH PROPOFOL FLEXIBLE SIGMOIDOSCOPY BIOPSY POLYPECTOMY  Patient Location: PACU  Anesthesia Type:General  Level of Consciousness: awake and patient cooperative  Airway & Oxygen Therapy: Patient Spontanous Breathing  Post-op Assessment: Report given to RN and Post -op Vital signs reviewed and stable  Post vital signs: Reviewed and stable  Last Vitals:  Vitals Value Taken Time  BP 143/70 11/08/22   0852  Temp 36.4 11/08/22   0852  Pulse 97 11/08/22   0852  Resp 23 11/08/22   0852  SpO2 98% 11/08/22   0852    Last Pain:  Vitals:   11/08/22 0656  TempSrc: Oral  PainSc: 0-No pain      Patients Stated Pain Goal: 5 (11/08/22 0656)  Complications: No notable events documented.

## 2022-11-10 LAB — SURGICAL PATHOLOGY

## 2022-11-11 ENCOUNTER — Encounter (INDEPENDENT_AMBULATORY_CARE_PROVIDER_SITE_OTHER): Payer: Self-pay | Admitting: *Deleted

## 2022-11-12 IMAGING — US US RENAL
1 series · 14 of 25 positions shown · non-contrast
Comparison: CT 02/14/2020

CLINICAL DATA: Hematuria

EXAM:
RENAL / URINARY TRACT ULTRASOUND COMPLETE

[Series 1: us renal · 14 of 32 slices shown]
[im 1/32]
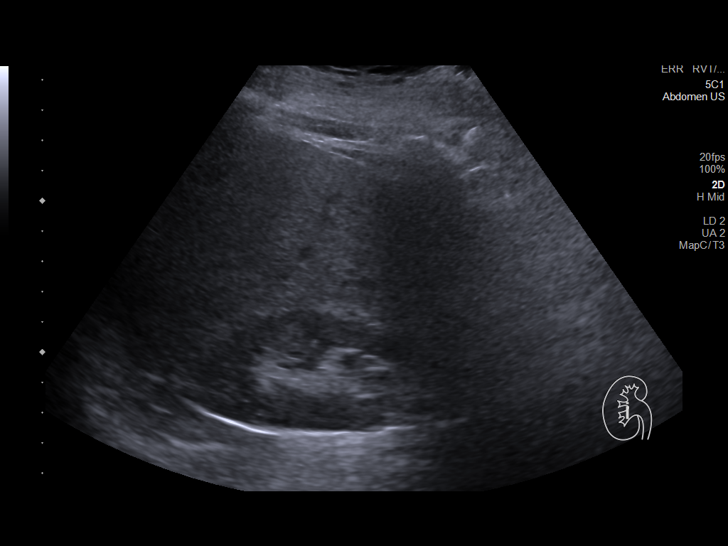
[im 3/32]
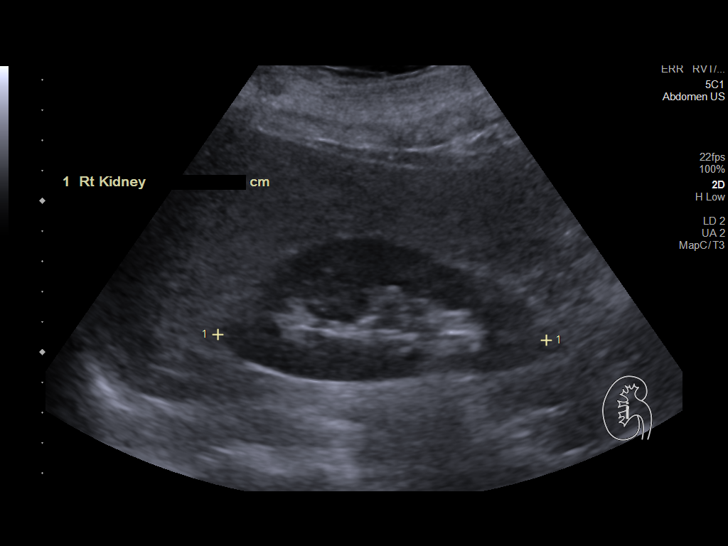
[im 6/32]
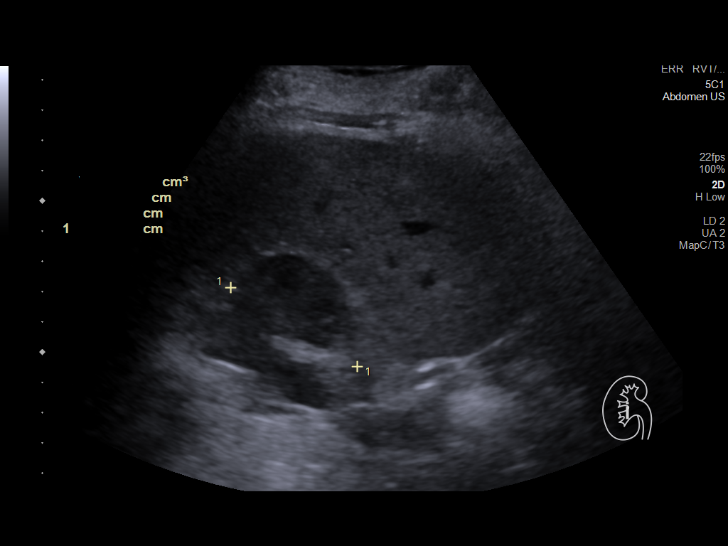
[im 8/32]
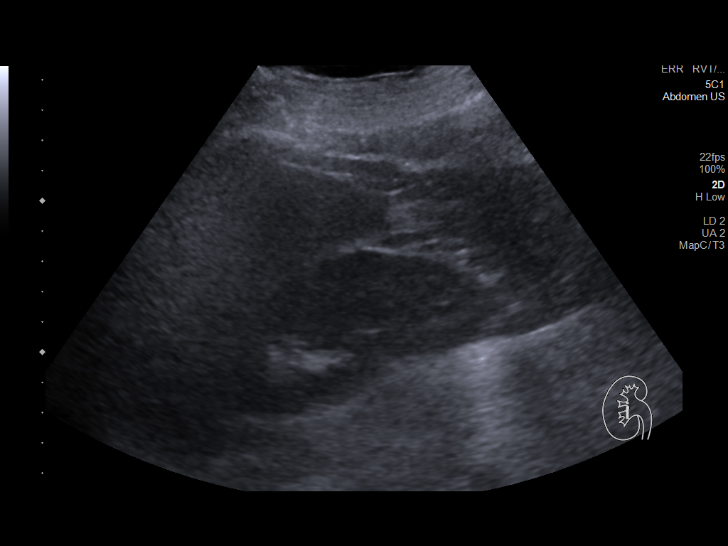
[im 11/32]
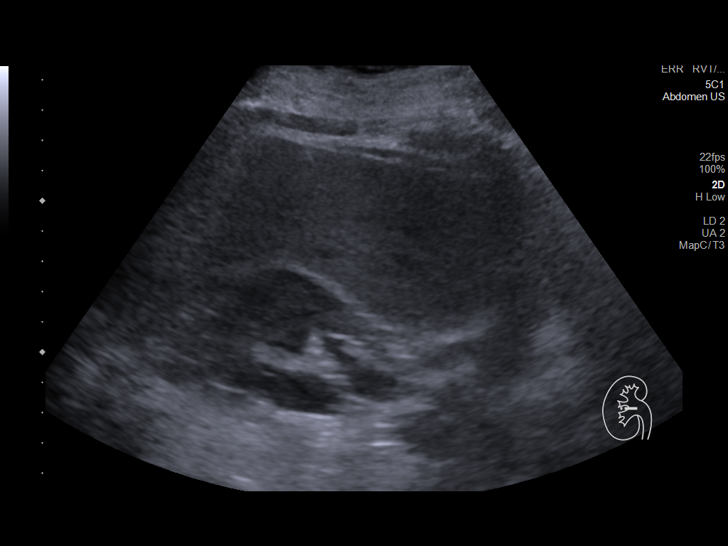
[im 12/32]
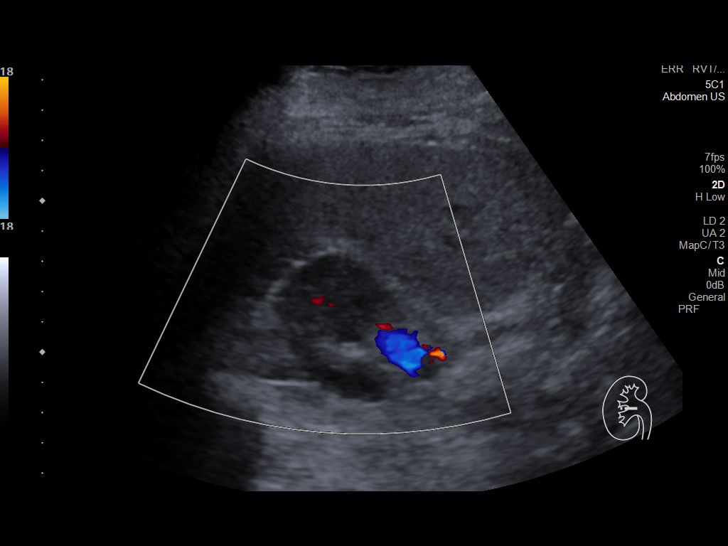
[im 15/32]
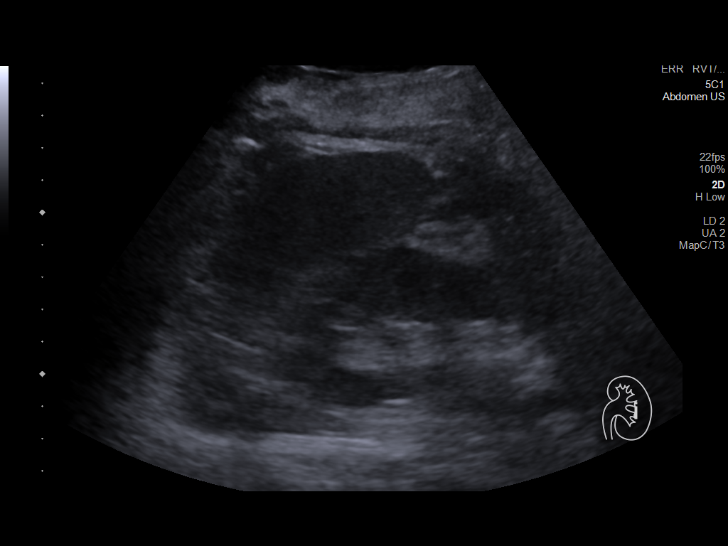
[im 17/32]
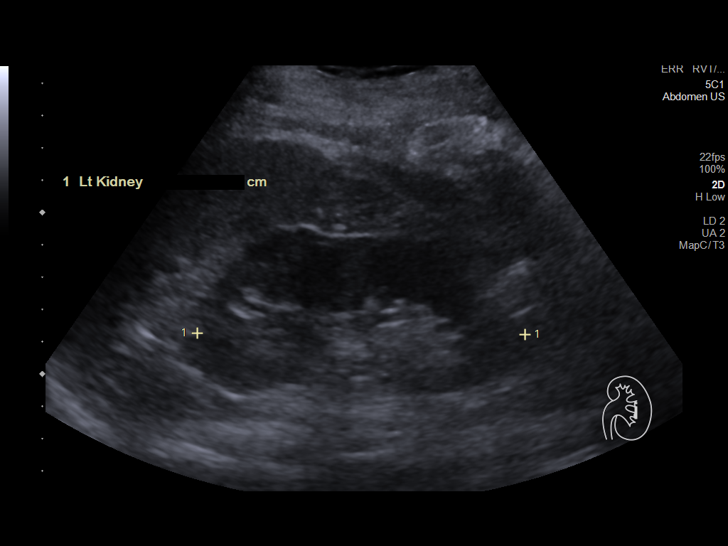
[im 20/32]
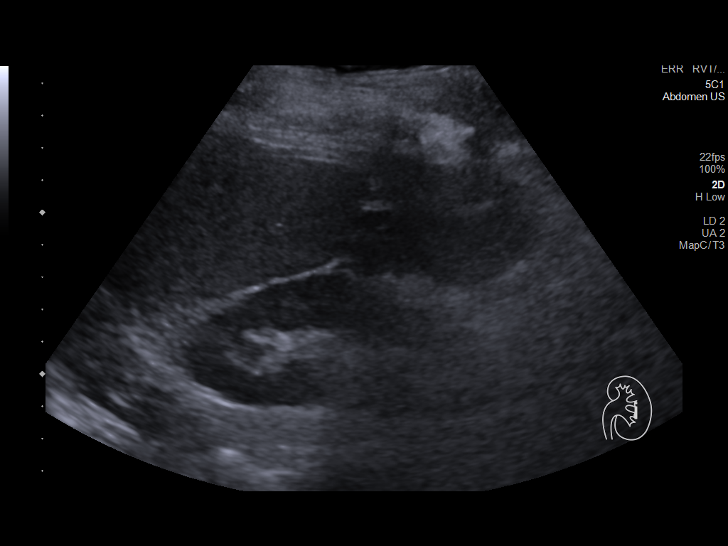
[im 21/32]
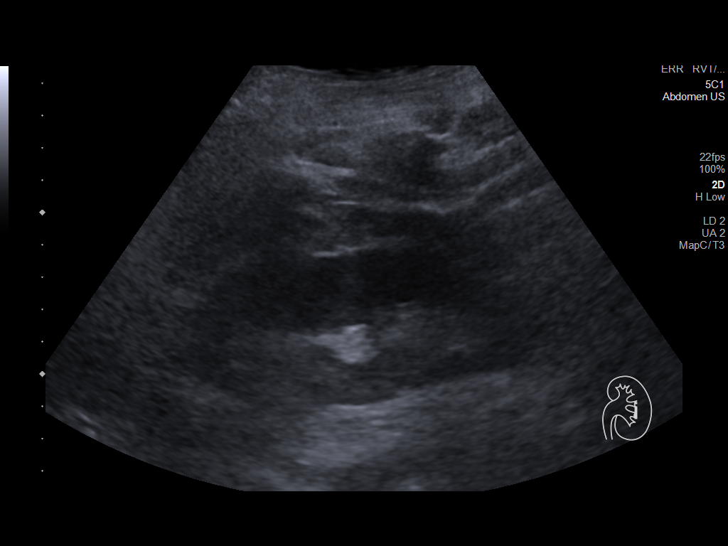
[im 24/32]
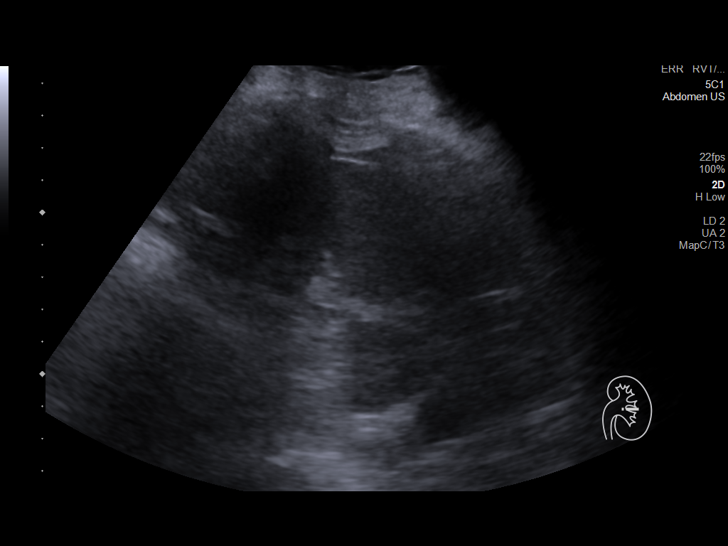
[im 26/32]
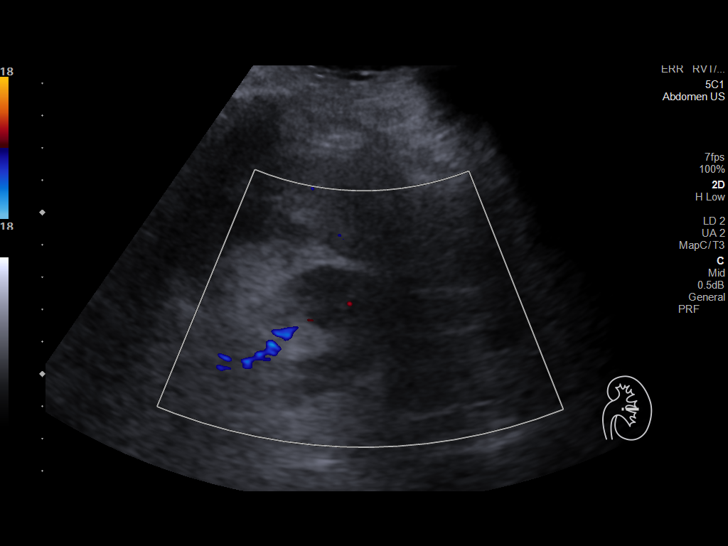
[im 29/32]
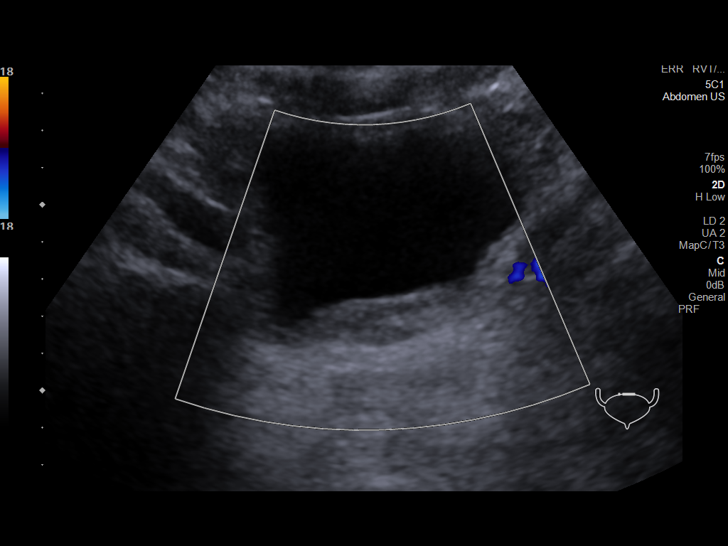
[im 32/32]
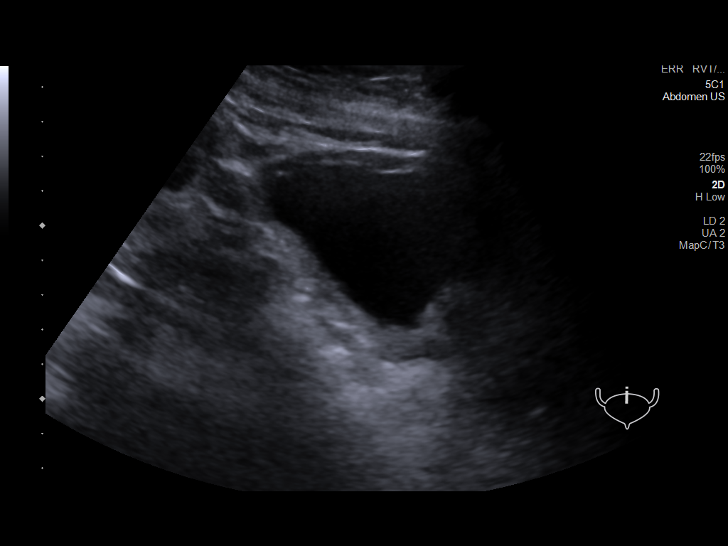

[14 of 25 positions shown; findings below may reference images not displayed]

FINDINGS: Right Kidney:

Renal measurements: 10.6 x 5.0 x 4.9 cm = volume: 137 mL.
Echogenicity within normal limits. No mass or hydronephrosis
visualized.

Left Kidney:

Renal measurements: 10 x 7 x 4.6 x 4.7 cm = volume: 121 mL.
Echogenicity within normal limits. No mass or hydronephrosis
visualized.

Bladder:

Diffuse bladder wall thickening similar to prior CT examination may
be due to chronic outlet obstruction and/or cystitis.

Other:

Diffuse increased echogenicity of the visualized hepatic parenchyma
is a nonspecific indicator of hepatocellular dysfunction, most
commonly steatosis.

Enlarged prostate partially visualized.
IMPRESSION: Unremarkable sonographic appearance of the kidneys. No
hydronephrosis.

## 2022-11-15 ENCOUNTER — Encounter (HOSPITAL_COMMUNITY): Payer: Self-pay | Admitting: Gastroenterology

## 2022-11-19 ENCOUNTER — Other Ambulatory Visit: Payer: Self-pay | Admitting: Gastroenterology

## 2023-10-09 ENCOUNTER — Encounter (INDEPENDENT_AMBULATORY_CARE_PROVIDER_SITE_OTHER): Payer: Self-pay | Admitting: *Deleted

## 2024-03-06 ENCOUNTER — Encounter (INDEPENDENT_AMBULATORY_CARE_PROVIDER_SITE_OTHER): Payer: Self-pay | Admitting: Gastroenterology
